# Patient Record
Sex: Female | Born: 1975 | Race: Black or African American | Hispanic: No | Smoking: Never smoker
Health system: Southern US, Community
[De-identification: ages and names within clinical notes are randomized; demographics above are authoritative.]

## PROBLEM LIST (undated history)

## (undated) DIAGNOSIS — R51 Headache: Secondary | ICD-10-CM

## (undated) DIAGNOSIS — N898 Other specified noninflammatory disorders of vagina: Secondary | ICD-10-CM

## (undated) DIAGNOSIS — J45909 Unspecified asthma, uncomplicated: Secondary | ICD-10-CM

## (undated) DIAGNOSIS — G8929 Other chronic pain: Secondary | ICD-10-CM

## (undated) DIAGNOSIS — N883 Incompetence of cervix uteri: Secondary | ICD-10-CM

## (undated) DIAGNOSIS — R109 Unspecified abdominal pain: Secondary | ICD-10-CM

## (undated) DIAGNOSIS — R1031 Right lower quadrant pain: Secondary | ICD-10-CM

## (undated) DIAGNOSIS — A599 Trichomoniasis, unspecified: Secondary | ICD-10-CM

## (undated) DIAGNOSIS — N920 Excessive and frequent menstruation with regular cycle: Secondary | ICD-10-CM

## (undated) DIAGNOSIS — M791 Myalgia, unspecified site: Secondary | ICD-10-CM

## (undated) DIAGNOSIS — R519 Headache, unspecified: Secondary | ICD-10-CM

## (undated) DIAGNOSIS — R609 Edema, unspecified: Secondary | ICD-10-CM

## (undated) DIAGNOSIS — R1032 Left lower quadrant pain: Secondary | ICD-10-CM

## (undated) DIAGNOSIS — R1011 Right upper quadrant pain: Secondary | ICD-10-CM

## (undated) DIAGNOSIS — R600 Localized edema: Secondary | ICD-10-CM

## (undated) DIAGNOSIS — M1990 Unspecified osteoarthritis, unspecified site: Secondary | ICD-10-CM

## (undated) HISTORY — PX: ENDOMETRIAL ABLATION: SHX621

## (undated) HISTORY — PX: OVARIAN CYST REMOVAL: SHX89

## (undated) HISTORY — DX: Right lower quadrant pain: R10.31

## (undated) HISTORY — PX: KNEE ARTHROSCOPY: SUR90

## (undated) HISTORY — DX: Excessive and frequent menstruation with regular cycle: N92.0

## (undated) HISTORY — DX: Unspecified asthma, uncomplicated: J45.909

## (undated) HISTORY — PX: TONSILLECTOMY: SUR1361

## (undated) HISTORY — DX: Left lower quadrant pain: R10.32

## (undated) HISTORY — PX: FOOT SURGERY: SHX648

## (undated) HISTORY — DX: Trichomoniasis, unspecified: A59.9

## (undated) HISTORY — DX: Incompetence of cervix uteri: N88.3

## (undated) HISTORY — DX: Myalgia, unspecified site: M79.10

## (undated) HISTORY — PX: TUBAL LIGATION: SHX77

## (undated) HISTORY — PX: OVARIAN CYST SURGERY: SHX726

## (undated) HISTORY — PX: DILATION AND CURETTAGE OF UTERUS: SHX78

## (undated) HISTORY — PX: ABDOMINAL HYSTERECTOMY: SHX81

## (undated) HISTORY — DX: Other specified noninflammatory disorders of vagina: N89.8

## (undated) HISTORY — PX: KNEE SURGERY: SHX244

## (undated) HISTORY — DX: Right upper quadrant pain: R10.11

---

## 2000-10-18 ENCOUNTER — Encounter: Payer: Self-pay | Admitting: *Deleted

## 2000-10-18 ENCOUNTER — Emergency Department (HOSPITAL_COMMUNITY): Admission: EM | Admit: 2000-10-18 | Discharge: 2000-10-18 | Payer: Self-pay | Admitting: *Deleted

## 2005-02-18 ENCOUNTER — Observation Stay (HOSPITAL_COMMUNITY): Admission: AD | Admit: 2005-02-18 | Discharge: 2005-02-19 | Payer: Self-pay | Admitting: Obstetrics and Gynecology

## 2007-03-08 ENCOUNTER — Ambulatory Visit: Payer: Self-pay | Admitting: Orthopedic Surgery

## 2008-04-07 ENCOUNTER — Ambulatory Visit (HOSPITAL_COMMUNITY): Admission: RE | Admit: 2008-04-07 | Discharge: 2008-04-07 | Payer: Self-pay | Admitting: Obstetrics & Gynecology

## 2008-04-17 ENCOUNTER — Other Ambulatory Visit: Admission: RE | Admit: 2008-04-17 | Discharge: 2008-04-17 | Payer: Self-pay | Admitting: Obstetrics and Gynecology

## 2008-04-18 ENCOUNTER — Ambulatory Visit (HOSPITAL_COMMUNITY): Admission: RE | Admit: 2008-04-18 | Discharge: 2008-04-18 | Payer: Self-pay | Admitting: Obstetrics & Gynecology

## 2009-05-12 ENCOUNTER — Ambulatory Visit (HOSPITAL_COMMUNITY): Admission: RE | Admit: 2009-05-12 | Discharge: 2009-05-12 | Payer: Self-pay | Admitting: Family Medicine

## 2009-05-12 ENCOUNTER — Encounter: Payer: Self-pay | Admitting: Cardiovascular Disease

## 2009-05-12 DIAGNOSIS — M79609 Pain in unspecified limb: Secondary | ICD-10-CM | POA: Insufficient documentation

## 2009-05-28 ENCOUNTER — Other Ambulatory Visit: Admission: RE | Admit: 2009-05-28 | Discharge: 2009-05-28 | Payer: Self-pay | Admitting: Obstetrics and Gynecology

## 2009-06-04 ENCOUNTER — Ambulatory Visit (HOSPITAL_COMMUNITY): Admission: RE | Admit: 2009-06-04 | Discharge: 2009-06-04 | Payer: Self-pay | Admitting: Obstetrics & Gynecology

## 2009-07-22 ENCOUNTER — Ambulatory Visit (HOSPITAL_COMMUNITY): Admission: RE | Admit: 2009-07-22 | Discharge: 2009-07-22 | Payer: Self-pay | Admitting: Obstetrics & Gynecology

## 2010-01-12 ENCOUNTER — Emergency Department (HOSPITAL_COMMUNITY)
Admission: EM | Admit: 2010-01-12 | Discharge: 2010-01-13 | Payer: Self-pay | Source: Home / Self Care | Admitting: Emergency Medicine

## 2010-02-28 ENCOUNTER — Encounter: Payer: Self-pay | Admitting: Family Medicine

## 2010-03-09 NOTE — Miscellaneous (Signed)
Summary: Orders Update  Clinical Lists Changes  Problems: Added new problem of LEG PAIN (ICD-729.5) Orders: Added new Test order of Venous Duplex Lower Extremity (Venous Duplex Lower) - Signed 

## 2010-04-19 LAB — COMPREHENSIVE METABOLIC PANEL
ALT: 16 U/L (ref 0–35)
AST: 17 U/L (ref 0–37)
Albumin: 4.2 g/dL (ref 3.5–5.2)
Alkaline Phosphatase: 87 U/L (ref 39–117)
BUN: 11 mg/dL (ref 6–23)
CO2: 27 mEq/L (ref 19–32)
Calcium: 9.5 mg/dL (ref 8.4–10.5)
Chloride: 105 mEq/L (ref 96–112)
Creatinine, Ser: 0.72 mg/dL (ref 0.4–1.2)
GFR calc Af Amer: >60 mL/min (ref >60)
GFR calc non Af Amer: >60 mL/min (ref >60)
Glucose, Bld: 90 mg/dL (ref 70–99)
Potassium: 3.7 mEq/L (ref 3.5–5.1)
Sodium: 140 mEq/L (ref 135–145)
Total Bilirubin: 0.4 mg/dL (ref 0.3–1.2)
Total Protein: 7.5 g/dL (ref 6.0–8.3)

## 2010-04-19 LAB — APTT: aPTT: 34 seconds (ref 24–37)

## 2010-04-19 LAB — CBC
HCT: 36.7 % (ref 36.0–46.0)
Hemoglobin: 12.6 g/dL (ref 12.0–15.0)
MCH: 28.8 pg (ref 26.0–34.0)
MCHC: 34.3 g/dL (ref 30.0–36.0)
MCV: 84 fL (ref 78.0–100.0)
Platelets: 261 10*3/uL (ref 150–400)
RBC: 4.37 MIL/uL (ref 3.87–5.11)
RDW: 13.7 % (ref 11.5–15.5)
WBC: 9.3 10*3/uL (ref 4.0–10.5)

## 2010-04-19 LAB — D-DIMER, QUANTITATIVE: D-Dimer, Quant: 0.42 ug/mL-FEU (ref 0.00–0.48)

## 2010-04-19 LAB — POCT CARDIAC MARKERS
CKMB, poc: <1 ng/mL — ABNORMAL LOW (ref 1.0–8.0)
Myoglobin, poc: 47.6 ng/mL (ref 12–200)
Troponin i, poc: <0.05 ng/mL (ref 0.00–0.09)

## 2010-04-19 LAB — PROTIME-INR
INR: 1 (ref 0.00–1.49)
Prothrombin Time: 13.4 seconds (ref 11.6–15.2)

## 2010-04-26 LAB — URINALYSIS, ROUTINE W REFLEX MICROSCOPIC
Bilirubin Urine: NEGATIVE
Glucose, UA: NEGATIVE mg/dL
Hgb urine dipstick: NEGATIVE
Ketones, ur: NEGATIVE mg/dL
Nitrite: NEGATIVE
Protein, ur: NEGATIVE mg/dL
Specific Gravity, Urine: 1.02 (ref 1.005–1.030)
Urobilinogen, UA: 0.2 mg/dL (ref 0.0–1.0)
pH: 7.5 (ref 5.0–8.0)

## 2010-04-26 LAB — COMPREHENSIVE METABOLIC PANEL
ALT: 35 U/L (ref 0–35)
AST: 24 U/L (ref 0–37)
Albumin: 3.9 g/dL (ref 3.5–5.2)
Alkaline Phosphatase: 81 U/L (ref 39–117)
BUN: 6 mg/dL (ref 6–23)
CO2: 26 mEq/L (ref 19–32)
Calcium: 9.2 mg/dL (ref 8.4–10.5)
Chloride: 104 mEq/L (ref 96–112)
Creatinine, Ser: 0.59 mg/dL (ref 0.4–1.2)
GFR calc Af Amer: >60 mL/min (ref >60)
GFR calc non Af Amer: >60 mL/min (ref >60)
Glucose, Bld: 86 mg/dL (ref 70–99)
Potassium: 4.1 mEq/L (ref 3.5–5.1)
Sodium: 137 mEq/L (ref 135–145)
Total Bilirubin: 0.4 mg/dL (ref 0.3–1.2)
Total Protein: 6.7 g/dL (ref 6.0–8.3)

## 2010-04-26 LAB — CBC
HCT: 37.7 % (ref 36.0–46.0)
Hemoglobin: 12.7 g/dL (ref 12.0–15.0)
MCHC: 33.6 g/dL (ref 30.0–36.0)
MCV: 88.4 fL (ref 78.0–100.0)
Platelets: 281 10*3/uL (ref 150–400)
RBC: 4.27 MIL/uL (ref 3.87–5.11)
RDW: 13.7 % (ref 11.5–15.5)
WBC: 7.8 10*3/uL (ref 4.0–10.5)

## 2010-04-26 LAB — HCG, QUANTITATIVE, PREGNANCY: hCG, Beta Chain, Quant, S: <2 m[IU]/mL (ref <5)

## 2010-04-26 LAB — SURGICAL PCR SCREEN
MRSA, PCR: NEGATIVE
Staphylococcus aureus: NEGATIVE

## 2010-06-25 NOTE — H&P (Signed)
Marie Morales, Marie Morales            ACCOUNT NO.:  [id number]   MEDICAL RECORD NO.:  [id number]          PATIENT TYPE:  OBV   LOCATION:  A428                          FACILITY:  [facility]   PHYSICIAN:  Tilda Burrow, M.D. DATE OF BIRTH:  [birth year]   DATE OF ADMISSION:  02/18/2005  DATE OF DISCHARGE:  LH                                HISTORY & PHYSICAL   CHIEF COMPLAINT:  Nausea, vomiting and constipation.   HISTORY OF PRESENT ILLNESS:  A 35 year old, G2, P1 who is approximately 56-  4/7 weeks' gestation who I have been seeing every few days for the past week  or so trying to manage her nausea and vomiting.  She does okay on the Zofran  and tries to go to work, but since last night she has just not been able to  keep anything down at all.  She does have 3+ ketones in her urine.  She also  complains of not having a bowel movement for a week.  She also complains of  a right flank aching type pain that she has been having for several months  now.  It starts in her right flank and radiates to her groin area.  It is  tender to touch and intermittent.  She says it lasts for about 45 minutes at  a time when she has it.  She has been having that ache more frequently.  An  ultrasound early on was done that did not reveal any abnormalities.  She has  complaints of some dizziness and weakness which is probably coming from her  dehydration.   IMPRESSION:  1.  Intrauterine pregnancy at 12-4/7 weeks.  2.  Nausea and vomiting.  3.  Dehydration.  4.  Constipation.  5.  Right flank pain radiating to groin, questionable abdominal wall pain.   PLAN:  We will send her over to labor and delivery for some hydration and  antiemetics and also milk and molasses enema.      Jacklyn Shell, C.N.M.      Tilda Burrow, M.D.  Electronically Signed    FC/MEDQ  D:  02/18/2005  T:  02/18/2005  Job:  [id number]

## 2010-12-24 ENCOUNTER — Other Ambulatory Visit: Payer: Self-pay | Admitting: Obstetrics & Gynecology

## 2012-04-19 IMAGING — US US TRANSVAGINAL NON-OB
1 series · 14 of 25 positions shown · non-contrast
Comparison: 04/07/2008

CLINICAL DATA: Right lower quadrant pain

TRANSABDOMINAL AND TRANSVAGINAL ULTRASOUND OF PELVIS
TECHNIQUE: Both transabdominal and transvaginal ultrasound
examinations of the pelvis were performed including evaluation of
the uterus, ovaries, adnexal regions, and pelvic cul-de-sac.

[Series 1: us transvaginal non-ob · 0.37mm/px · 77 acquisitions, 14 frames shown]
[im 1/77]
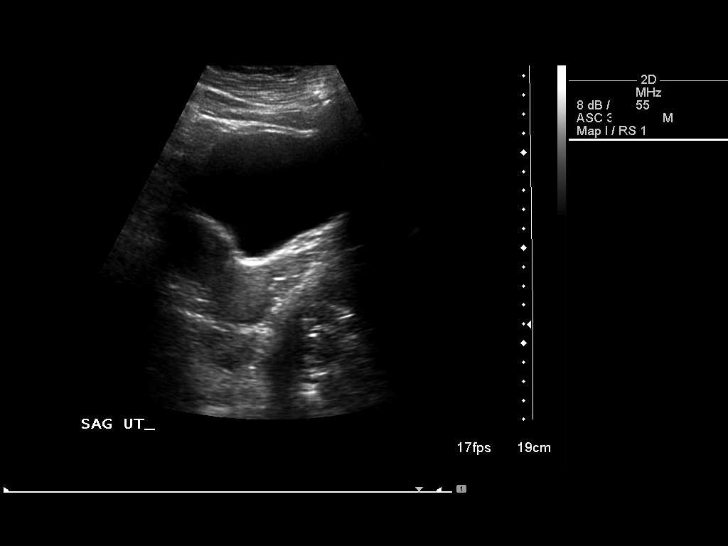
[im 7/77]
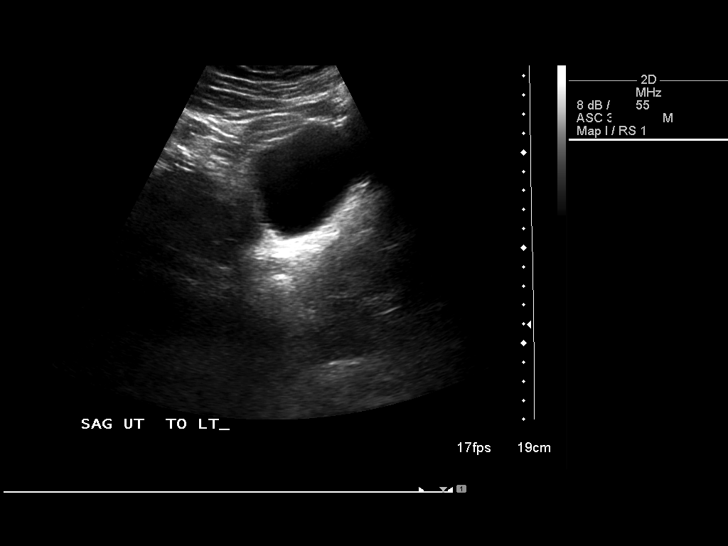
[im 13/77]
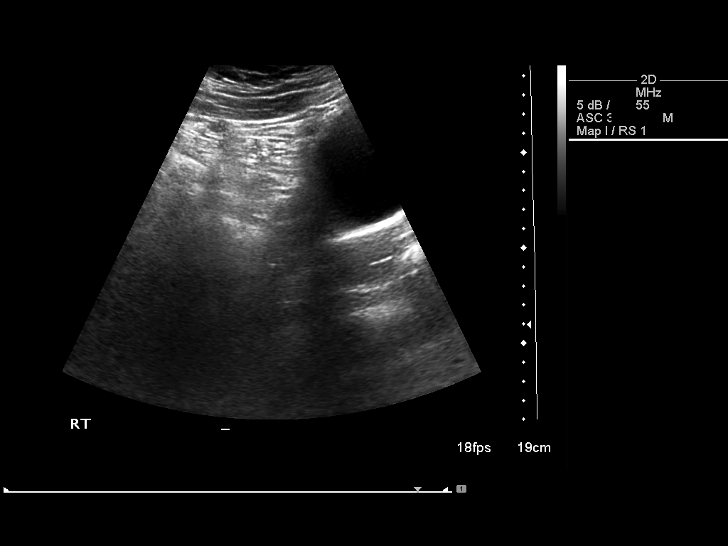
[im 20/77]
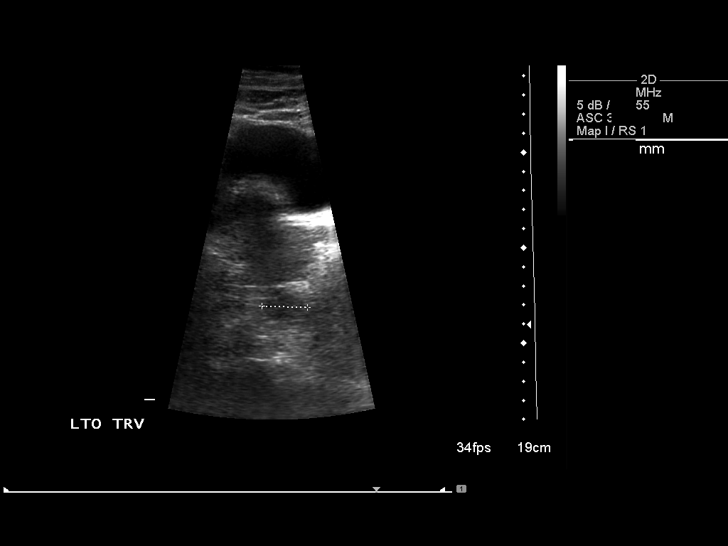
[im 26/77]
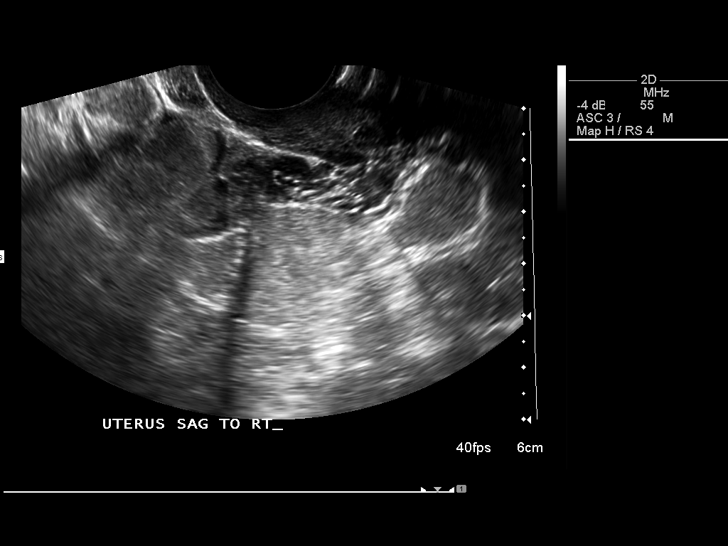
[im 29/77]
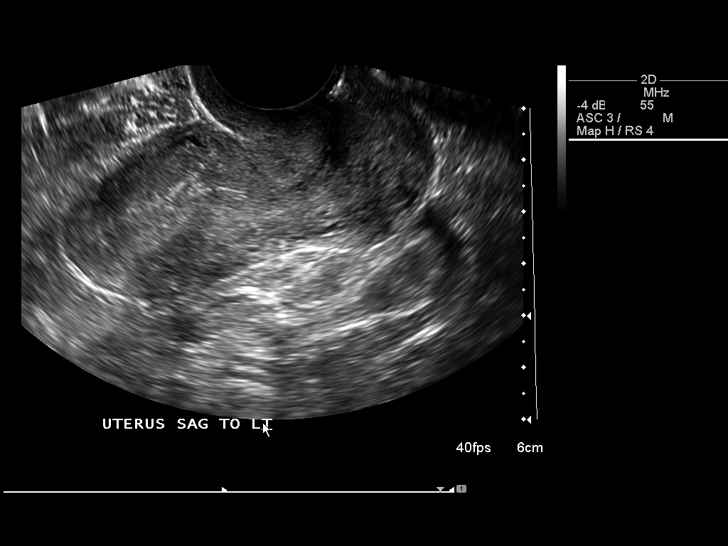
[im 35/77]
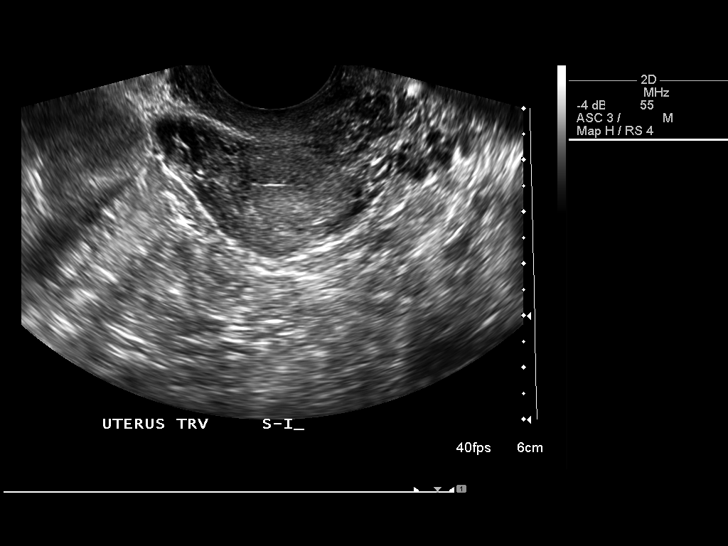
[im 42/77]
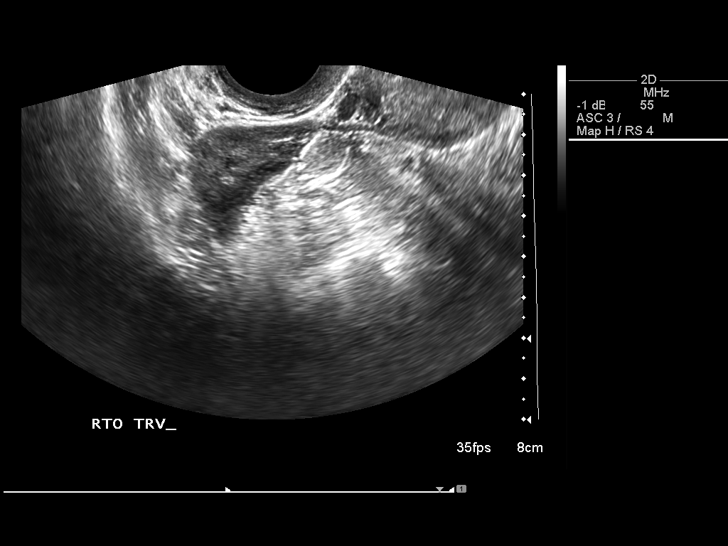
[im 48/77]
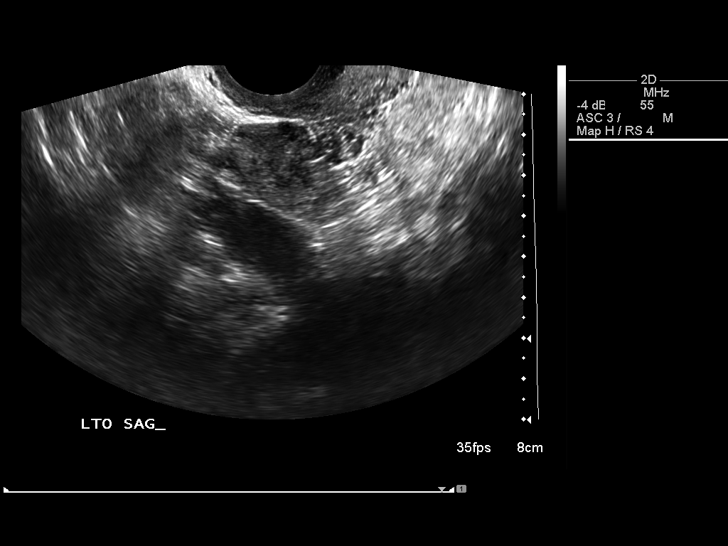
[im 51/77]
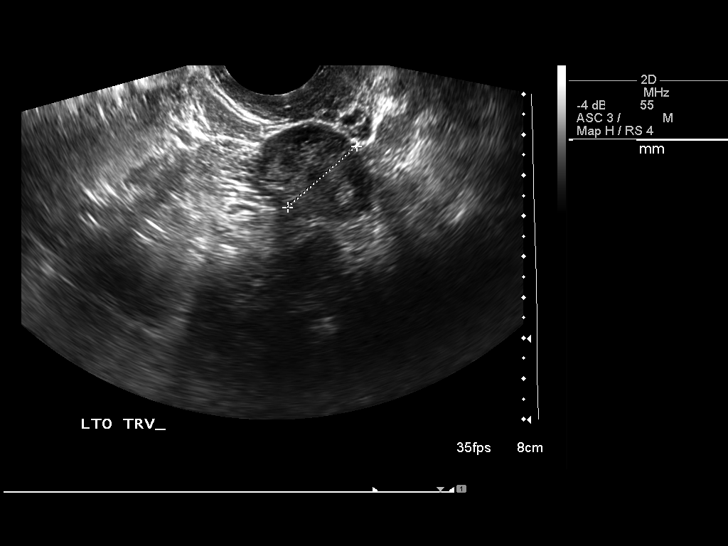
[im 58/77]
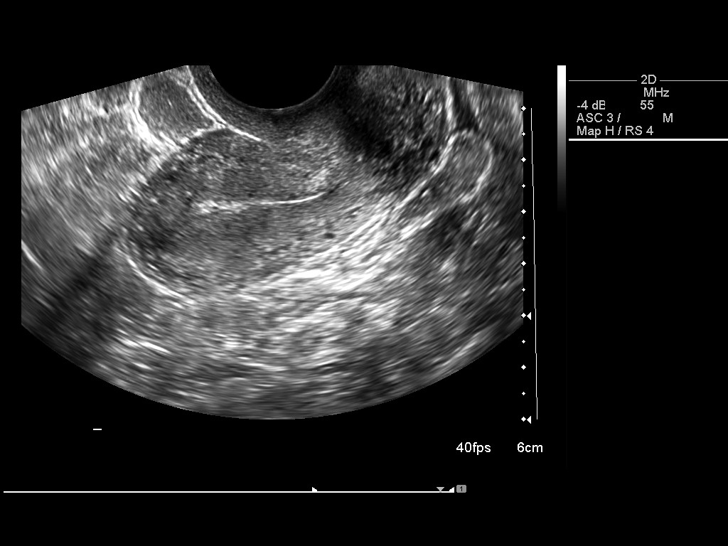
[im 64/77]
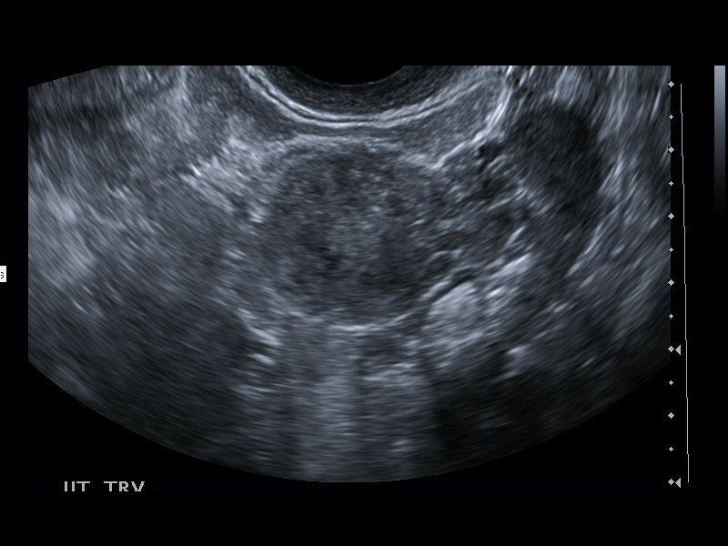
[im 70/77]
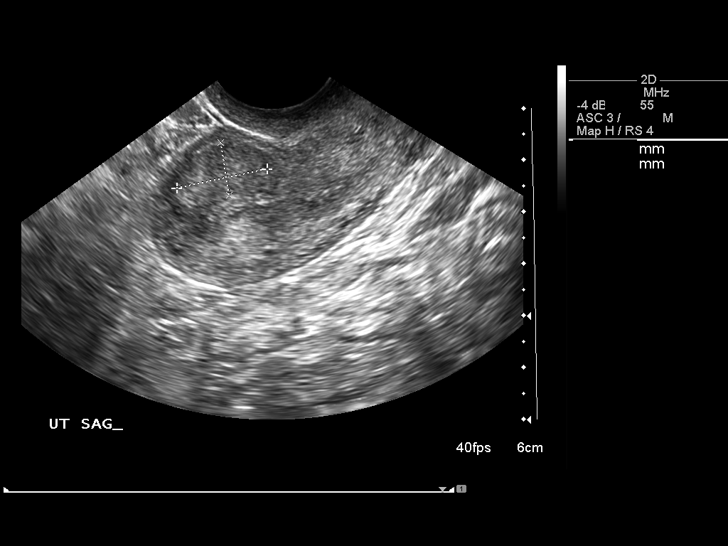
[im 77/77]
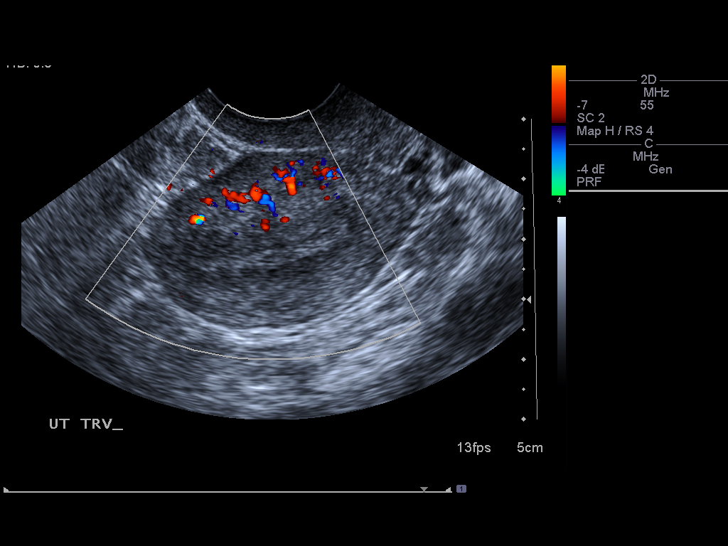

[14 of 25 positions shown; findings below may reference images not displayed]

FINDINGS: Uterus the uterus demonstrates a sagittal length of 7.2 cm, an AP
width of 3.1 cm and a transverse width of 3.6 cm.  The uterine
myometrium appears homogeneous.

Endometrium is thin and echogenic with an AP width of 2.8 mm.  No
areas of focal thickening or inhomogeneity are noted

Right Ovary has a normal appearance measuring 2.9 x 3.2 x 2.4 cm

Left Ovary has a normal appearance measuring 3.5 x 2.1 x 2.3 cm

Other Findings:  A small amount of simple free fluid is noted in
the cul-de-sac.
IMPRESSION: Unremarkable pelvic ultrasound.

## 2012-05-30 ENCOUNTER — Telehealth: Payer: Self-pay | Admitting: *Deleted

## 2012-05-30 NOTE — Telephone Encounter (Signed)
Pain on both sides of abdomen, hx of ovarian cyst. Pain rated at 9 on 1-10 scale. Taking Ibuprofen 800 mg and taking hydrocodone due to pain.  Informed pt may need to go to ER if severe pain persist before being able to be seen at our office, pt stated preferred to be seen here. Transferred pt to front office to schedule appt for next available appt.

## 2012-05-31 ENCOUNTER — Encounter: Payer: Self-pay | Admitting: *Deleted

## 2012-05-31 DIAGNOSIS — J45909 Unspecified asthma, uncomplicated: Secondary | ICD-10-CM | POA: Insufficient documentation

## 2012-06-01 ENCOUNTER — Encounter: Payer: Self-pay | Admitting: Obstetrics & Gynecology

## 2012-06-01 ENCOUNTER — Ambulatory Visit (INDEPENDENT_AMBULATORY_CARE_PROVIDER_SITE_OTHER): Payer: No Typology Code available for payment source | Admitting: Obstetrics & Gynecology

## 2012-06-01 VITALS — BP 128/80 | Ht 73.0 in | Wt 261.0 lb

## 2012-06-01 DIAGNOSIS — R1032 Left lower quadrant pain: Secondary | ICD-10-CM

## 2012-06-01 DIAGNOSIS — R1031 Right lower quadrant pain: Secondary | ICD-10-CM

## 2012-06-01 MED ORDER — HYDROCODONE-ACETAMINOPHEN 5-325 MG PO TABS: 1.0000 | ORAL_TABLET | Freq: Four times a day (QID) | ORAL | Status: DC | PRN

## 2012-06-01 NOTE — Progress Notes (Signed)
Patient ID: Marie Morales, female   DOB: [birth year], 37 y.o.   MRN: [id number] Patient with pain for 5 months.  Bilateral L.>R Sharp especially with twisting movements Last a few seconds to a minute Radiates to R leg>L leg  Exam Point tenderness with contracted abs at junction of rectus abdominus an obliques Pelvic No cmt Uterus nssc Ovaries non tender no masses  Keep yearly appt scheduled

## 2012-06-01 NOTE — Addendum Note (Signed)
Addended by: Lazaro Arms on: 06/01/2012 10:49 AM   Modules accepted: Orders

## 2012-06-18 ENCOUNTER — Other Ambulatory Visit: Payer: Self-pay | Admitting: Adult Health

## 2012-08-01 ENCOUNTER — Other Ambulatory Visit: Payer: Self-pay | Admitting: Adult Health

## 2012-08-22 ENCOUNTER — Other Ambulatory Visit: Payer: Self-pay | Admitting: Adult Health

## 2012-08-22 ENCOUNTER — Ambulatory Visit (HOSPITAL_COMMUNITY)
Admission: RE | Admit: 2012-08-22 | Discharge: 2012-08-22 | Disposition: A | Discharge status: Home / Self Care | Payer: BC Managed Care – PPO | Source: Ambulatory Visit | Attending: Adult Health | Admitting: Adult Health

## 2012-08-22 ENCOUNTER — Encounter: Payer: Self-pay | Admitting: Adult Health

## 2012-08-22 ENCOUNTER — Ambulatory Visit (INDEPENDENT_AMBULATORY_CARE_PROVIDER_SITE_OTHER): Payer: BC Managed Care – PPO | Admitting: Adult Health

## 2012-08-22 ENCOUNTER — Telehealth: Payer: Self-pay | Admitting: Adult Health

## 2012-08-22 VITALS — BP 104/80 | Ht 73.0 in | Wt 254.0 lb

## 2012-08-22 DIAGNOSIS — R1032 Left lower quadrant pain: Secondary | ICD-10-CM

## 2012-08-22 DIAGNOSIS — N898 Other specified noninflammatory disorders of vagina: Secondary | ICD-10-CM

## 2012-08-22 DIAGNOSIS — A599 Trichomoniasis, unspecified: Secondary | ICD-10-CM

## 2012-08-22 DIAGNOSIS — R1011 Right upper quadrant pain: Secondary | ICD-10-CM

## 2012-08-22 DIAGNOSIS — R300 Dysuria: Secondary | ICD-10-CM

## 2012-08-22 DIAGNOSIS — N83209 Unspecified ovarian cyst, unspecified side: Secondary | ICD-10-CM | POA: Insufficient documentation

## 2012-08-22 DIAGNOSIS — N72 Inflammatory disease of cervix uteri: Secondary | ICD-10-CM | POA: Insufficient documentation

## 2012-08-22 HISTORY — DX: Trichomoniasis, unspecified: A59.9

## 2012-08-22 HISTORY — DX: Left lower quadrant pain: R10.32

## 2012-08-22 HISTORY — DX: Right upper quadrant pain: R10.11

## 2012-08-22 LAB — POCT WET PREP (WET MOUNT)
Trichomonas Wet Prep HPF POC: POSITIVE
WBC, Wet Prep HPF POC: POSITIVE

## 2012-08-22 LAB — COMPREHENSIVE METABOLIC PANEL
ALT: 14 U/L (ref 0–35)
AST: 13 U/L (ref 0–37)
Albumin: 4.1 g/dL (ref 3.5–5.2)
Alkaline Phosphatase: 71 U/L (ref 39–117)
BUN: 6 mg/dL (ref 6–23)
CO2: 25 mEq/L (ref 19–32)
Calcium: 9.4 mg/dL (ref 8.4–10.5)
Chloride: 105 mEq/L (ref 96–112)
Creat: 0.65 mg/dL (ref 0.50–1.10)
Glucose, Bld: 68 mg/dL — ABNORMAL LOW (ref 70–99)
Potassium: 4.4 mEq/L (ref 3.5–5.3)
Sodium: 138 mEq/L (ref 135–145)
Total Bilirubin: 0.5 mg/dL (ref 0.3–1.2)
Total Protein: 6.5 g/dL (ref 6.0–8.3)

## 2012-08-22 LAB — CBC
HCT: 38.7 % (ref 36.0–46.0)
Hemoglobin: 13 g/dL (ref 12.0–15.0)
MCH: 29.3 pg (ref 26.0–34.0)
MCHC: 33.6 g/dL (ref 30.0–36.0)
MCV: 87.2 fL (ref 78.0–100.0)
Platelets: 292 10*3/uL (ref 150–400)
RBC: 4.44 MIL/uL (ref 3.87–5.11)
RDW: 13.8 % (ref 11.5–15.5)
WBC: 5.8 10*3/uL (ref 4.0–10.5)

## 2012-08-22 LAB — POCT URINALYSIS DIPSTICK
Blood, UA: 1
Glucose, UA: NEGATIVE
Ketones, UA: NEGATIVE
Nitrite, UA: NEGATIVE
Protein, UA: NEGATIVE

## 2012-08-22 MED ORDER — METRONIDAZOLE 500 MG PO TABS: 500.0000 mg | ORAL_TABLET | Freq: Two times a day (BID) | ORAL | Status: DC

## 2012-08-22 NOTE — Progress Notes (Signed)
Subjective:     Patient ID: Marie Morales, female   DOB: 1975/09/29, 37 y.o.   MRN: [id number]  HPI Marie Morales is a 37 year old black female in complaining of pain in RUQ area and right flank x 4 weeks worse after eating pizza.Pain in LLQ and recent bleeding and clotting after abaltion and vaginal discharge and feels like bladder not emptying. She has had nausea no vomiting.  Review of Systems Positives in HPI Reviewed past medical,surgical, social and family history. Reviewed medications and allergies.     Objective:   Physical Exam BP 104/80  Ht 6\' 1"  (1.854 m)  Wt 254 lb (115.214 kg)  BMI 33.52 kg/m2Urine dipstick +blood Skin warm and dry.Pelvic: external genitalia is normal in appearance, vagina: tan discharge with odor, cervix:smooth and bulbous,negative CMT, uterus: normal size, shape and contour, non tender, no masses felt, adnexa: no masses, there is tenderness in LLQ. Wet prep: + for trich and +WBCs. GC/CHL obtained.    On abdominal exam there is RUQ tenderness and pain in right flank with palpation, no masses felt.  Assessment:      RUQ pain  LLQ pain Vaginal discharge Trichomonas Hematuria     Plan:      Check CBC,CMP,ESR and GC/CHL Rx flagyl 500 mg 1 bid x 7 days for pt and partner, no sex and no alcohol Review handout on trich ABD [place] and Pelvic US today at 2 pm at [facility], to r/o gallbladder or kidney stone Follow in 10 days proof of treatment of trich, and will talk in am about labs and [place]   Take motrin for pain for now

## 2012-08-22 NOTE — Telephone Encounter (Signed)
Pt aware of US results. 

## 2012-08-22 NOTE — Patient Instructions (Addendum)
Trichomoniasis Trichomoniasis is an infection, caused by the Trichomonas organism, that affects both women and men. In women, the outer female genitalia and the vagina are affected. In men, the penis is mainly affected, but the prostate and other reproductive organs can also be involved. Trichomoniasis is a sexually transmitted disease (STD) and is most often passed to another person through sexual contact. The majority of people who get trichomoniasis do so from a sexual encounter and are also at risk for other STDs. CAUSES   Sexual intercourse with an infected partner.  It can be present in swimming pools or hot tubs. SYMPTOMS   Abnormal gray-green frothy vaginal discharge in women.  Vaginal itching and irritation in women.  Itching and irritation of the area outside the vagina in women.  Penile discharge with or without pain in males.  Inflammation of the urethra (urethritis), causing painful urination.  Bleeding after sexual intercourse. RELATED COMPLICATIONS  Pelvic inflammatory disease.  Infection of the uterus (endometritis).  Infertility.  Tubal (ectopic) pregnancy.  It can be associated with other STDs, including gonorrhea and chlamydia, hepatitis B, and HIV. COMPLICATIONS DURING PREGNANCY  Early (premature) delivery.  Premature rupture of the membranes (PROM).  Low birth weight. DIAGNOSIS   Visualization of Trichomonas under the microscope from the vagina discharge.  Ph of the vagina greater than 4.5, tested with a test tape.  Trich Rapid Test.  Culture of the organism, but this is not usually needed.  It may be found on a Pap test.  Having a "strawberry cervix,"which means the cervix looks very red like a strawberry. TREATMENT   You may be given medication to fight the infection. Inform your caregiver if you could be or are pregnant. Some medications used to treat the infection should not be taken during pregnancy.  Over-the-counter medications or  creams to decrease itching or irritation may be recommended.  Your sexual partner will need to be treated if infected. HOME CARE INSTRUCTIONS   Take all medication prescribed by your caregiver.  Take over-the-counter medication for itching or irritation as directed by your caregiver.  Do not have sexual intercourse while you have the infection.  Do not douche or wear tampons.  Discuss your infection with your partner, as your partner may have acquired the infection from you. Or, your partner may have been the person who transmitted the infection to you.  Have your sex partner examined and treated if necessary.  Practice safe, informed, and protected sex.  See your caregiver for other STD testing. SEEK MEDICAL CARE IF:   You still have symptoms after you finish the medication.  You have an oral temperature above 102 F (38.9 C).  You develop belly (abdominal) pain.  You have pain when you urinate.  You have bleeding after sexual intercourse.  You develop a rash.  The medication makes you sick or makes you throw up (vomit). Document Released: 07/20/2000 Document Revised: 04/18/2011 Document Reviewed: 08/15/2008 [facility] Patient Information 2014 [place], [place]. Follow up in 10 days Get [place] today  Call for labs in am

## 2012-08-23 ENCOUNTER — Telehealth: Payer: Self-pay | Admitting: Adult Health

## 2012-08-23 ENCOUNTER — Other Ambulatory Visit (HOSPITAL_COMMUNITY): Payer: Self-pay

## 2012-08-23 LAB — SEDIMENTATION RATE

## 2012-08-23 LAB — GC/CHLAMYDIA PROBE AMP
CT Probe RNA: NEGATIVE
GC Probe RNA: NEGATIVE

## 2012-08-23 NOTE — Telephone Encounter (Signed)
Pt aware of labs and needs note for missing work 7/15 & 7/16

## 2012-09-03 ENCOUNTER — Ambulatory Visit: Payer: BC Managed Care – PPO | Admitting: Adult Health

## 2012-09-07 ENCOUNTER — Ambulatory Visit: Payer: BC Managed Care – PPO | Admitting: Adult Health

## 2012-09-10 ENCOUNTER — Encounter: Payer: Self-pay | Admitting: Adult Health

## 2012-09-10 ENCOUNTER — Ambulatory Visit (INDEPENDENT_AMBULATORY_CARE_PROVIDER_SITE_OTHER): Payer: BC Managed Care – PPO | Admitting: Adult Health

## 2012-09-10 VITALS — BP 118/84 | Ht 73.0 in | Wt 257.0 lb

## 2012-09-10 DIAGNOSIS — A599 Trichomoniasis, unspecified: Secondary | ICD-10-CM

## 2012-09-10 DIAGNOSIS — R1031 Right lower quadrant pain: Secondary | ICD-10-CM | POA: Insufficient documentation

## 2012-09-10 DIAGNOSIS — IMO0001 Reserved for inherently not codable concepts without codable children: Secondary | ICD-10-CM

## 2012-09-10 DIAGNOSIS — M791 Myalgia, unspecified site: Secondary | ICD-10-CM

## 2012-09-10 HISTORY — DX: Right lower quadrant pain: R10.31

## 2012-09-10 HISTORY — DX: Myalgia, unspecified site: M79.10

## 2012-09-10 LAB — POCT WET PREP (WET MOUNT)
Trichomonas Wet Prep HPF POC: NEGATIVE
WBC, Wet Prep HPF POC: NEGATIVE

## 2012-09-10 MED ORDER — METAXALONE 800 MG PO TABS: 800.0000 mg | ORAL_TABLET | Freq: Three times a day (TID) | ORAL | Status: DC

## 2012-09-10 NOTE — Progress Notes (Signed)
Subjective:     Patient ID: MAELEY MATTON, female   DOB: [birth year], 37 y.o.   MRN: [id number]  HPI Naliyah is back for proof of treatment of recent trichomonas and is complaining of pain in right side still.the US showed bilateral cysts.  Review of Systems Positives in HPI  Reviewed past medical,surgical, social and family history. Reviewed medications and allergies.      Objective:   Physical Exam BP 118/84  Ht 6\' 1"  (1.854 m)  Wt 257 lb (116.574 kg)  BMI 33.91 kg/m2   Skin warm and dry.Pelvic: external genitalia is normal in appearance, vagina: scant discharge without odor, cervix:smooth and bulbous, uterus: normal size, shape and contour, non tender, no masses felt, adnexa: no masses noted. Tender RLQ ?muscle. Wet prep: negative  Assessment:     RLQ pain ?muscle Myalgia Resolved trichomonas     Plan:     RX skelaxin 800 mg #30 1 every 8 hours no refills Try stretches and ice and review handout on myalgia Follow up in 4 weeks

## 2012-09-10 NOTE — Patient Instructions (Addendum)
Myalgia, Adult Myalgia is the medical term for muscle pain. It is a symptom of many things. Nearly everyone at some time in their life has this. The most common cause for muscle pain is overuse or straining and more so when you are not in shape. Injuries and muscle bruises cause myalgias. Muscle pain without a history of injury can also be caused by a virus. It frequently comes along with the flu. Myalgia not caused by muscle strain can be present in a large number of infectious diseases. Some autoimmune diseases like lupus and fibromyalgia can cause muscle pain. Myalgia may be mild, or severe. SYMPTOMS  The symptoms of myalgia are simply muscle pain. Most of the time this is short lived and the pain goes away without treatment. DIAGNOSIS  Myalgia is diagnosed by your caregiver by taking your history. This means you tell him when the problems began, what they are, and what has been happening. If this has not been a long term problem, your caregiver may want to watch for a while to see what will happen. If it has been long term, they may want to do additional testing. TREATMENT  The treatment depends on what the underlying cause of the muscle pain is. Often anti-inflammatory medications will help. HOME CARE INSTRUCTIONS  If the pain in your muscles came from overuse, slow down your activities until the problems go away.  Myalgia from overuse of a muscle can be treated with alternating hot and cold packs on the muscle affected or with cold for the first couple days. If either heat or cold seems to make things worse, stop their use.  Apply ice to the sore area for 15-20 minutes, 3-4 times per day, while awake for the first 2 days of muscle soreness, or as directed. Put the ice in a plastic bag and place a towel between the bag of ice and your skin.  Only take over-the-counter or prescription medicines for pain, discomfort, or fever as directed by your caregiver.  Regular gentle exercise may help if  you are not active.  Stretching before strenuous exercise can help lower the risk of myalgia. It is normal when beginning an exercise regimen to feel some muscle pain after exercising. Muscles that have not been used frequently will be sore at first. If the pain is extreme, this may mean injury to a muscle. SEEK MEDICAL CARE IF:  You have an increase in muscle pain that is not relieved with medication.  You begin to run a temperature.  You develop nausea and vomiting.  You develop a stiff and painful neck.  You develop a rash.  You develop muscle pain after a tick bite.  You have continued muscle pain while working out even after you are in good condition. SEEK IMMEDIATE MEDICAL CARE IF: Any of your problems are getting worse and medications are not helping. MAKE SURE YOU:   Understand these instructions.  Will watch your condition.  Will get help right away if you are not doing well or get worse. Document Released: 12/16/2005 Document Revised: 04/18/2011 Document Reviewed: 03/07/2006 [facility] Patient Information 2014 [place], [place]. Follow up in 4 weeks Try skelexin and motrin and try ice and stretches

## 2012-10-09 ENCOUNTER — Ambulatory Visit: Payer: BC Managed Care – PPO | Admitting: Adult Health

## 2012-10-10 ENCOUNTER — Ambulatory Visit: Payer: BC Managed Care – PPO | Admitting: Adult Health

## 2012-10-23 ENCOUNTER — Ambulatory Visit: Payer: BC Managed Care – PPO | Admitting: Adult Health

## 2013-02-26 ENCOUNTER — Ambulatory Visit (INDEPENDENT_AMBULATORY_CARE_PROVIDER_SITE_OTHER): Payer: BC Managed Care – PPO | Admitting: Adult Health

## 2013-02-26 ENCOUNTER — Encounter: Payer: Self-pay | Admitting: Adult Health

## 2013-02-26 VITALS — BP 112/76 | Ht 73.0 in | Wt 253.0 lb

## 2013-02-26 DIAGNOSIS — A599 Trichomoniasis, unspecified: Secondary | ICD-10-CM

## 2013-02-26 DIAGNOSIS — N949 Unspecified condition associated with female genital organs and menstrual cycle: Secondary | ICD-10-CM

## 2013-02-26 DIAGNOSIS — N898 Other specified noninflammatory disorders of vagina: Secondary | ICD-10-CM

## 2013-02-26 HISTORY — DX: Other specified noninflammatory disorders of vagina: N89.8

## 2013-02-26 LAB — POCT WET PREP (WET MOUNT)
Trichomonas Wet Prep HPF POC: POSITIVE
WBC, Wet Prep HPF POC: POSITIVE

## 2013-02-26 MED ORDER — METRONIDAZOLE 500 MG PO TABS: ORAL_TABLET | ORAL | Status: DC

## 2013-02-26 NOTE — Progress Notes (Signed)
Subjective:     Patient ID: Marie Morales, female   DOB: 10-14-1975, 38 y.o.   MRN: [id number]  HPI Marie Morales is a 38 year old black female in complaining of vaginal discharge and irritation, used different soap.Last sex Thanksgiving.Spotting some since ablation and pain in left side at times.   Review of Systems See HPI Reviewed past medical,surgical, social and family history. Reviewed medications and allergies.     Objective:   Physical Exam BP 112/76  Ht 6\' 1"  (1.854 m)  Wt 253 lb (114.76 kg)  BMI 33.39 kg/m2    Skin warm and dry.Pelvic: external genitalia is normal in appearance, vagina: pink discharge with odor, cervix:smooth and bulbous, uterus: normal size, shape and contour, non tender, no masses felt, adnexa: no masses or tenderness noted. Wet prep: + for trich and +WBCs. GC/CHL obtained.  Assessment:    Vaginal discharge Trichomoniasis Pelvic pain esp left    Plan:     Rx flagyl 500 mg #4 take 4 po now no sex or alcohol Review handout  Follow up in 10 days for POT and [place] and see me   Check GC/CHL

## 2013-02-26 NOTE — Patient Instructions (Signed)
Trichomoniasis Trichomoniasis is an infection, caused by the Trichomonas organism, that affects both women and men. In women, the outer female genitalia and the vagina are affected. In men, the penis is mainly affected, but the prostate and other reproductive organs can also be involved. Trichomoniasis is a sexually transmitted disease (STD) and is most often passed to another person through sexual contact. The majority of people who get trichomoniasis do so from a sexual encounter and are also at risk for other STDs. CAUSES   Sexual intercourse with an infected partner.  It can be present in swimming pools or hot tubs. SYMPTOMS   Abnormal gray-green frothy vaginal discharge in women.  Vaginal itching and irritation in women.  Itching and irritation of the area outside the vagina in women.  Penile discharge with or without pain in males.  Inflammation of the urethra (urethritis), causing painful urination.  Bleeding after sexual intercourse. RELATED COMPLICATIONS  Pelvic inflammatory disease.  Infection of the uterus (endometritis).  Infertility.  Tubal (ectopic) pregnancy.  It can be associated with other STDs, including gonorrhea and chlamydia, hepatitis B, and HIV. COMPLICATIONS DURING PREGNANCY  Early (premature) delivery.  Premature rupture of the membranes (PROM).  Low birth weight. DIAGNOSIS   Visualization of Trichomonas under the microscope from the vagina discharge.  Ph of the vagina greater than 4.5, tested with a test tape.  Trich Rapid Test.  Culture of the organism, but this is not usually needed.  It may be found on a Pap test.  Having a "strawberry cervix,"which means the cervix looks very red like a strawberry. TREATMENT   You may be given medication to fight the infection. Inform your caregiver if you could be or are pregnant. Some medications used to treat the infection should not be taken during pregnancy.  Over-the-counter medications or  creams to decrease itching or irritation may be recommended.  Your sexual partner will need to be treated if infected. HOME CARE INSTRUCTIONS   Take all medication prescribed by your caregiver.  Take over-the-counter medication for itching or irritation as directed by your caregiver.  Do not have sexual intercourse while you have the infection.  Do not douche or wear tampons.  Discuss your infection with your partner, as your partner may have acquired the infection from you. Or, your partner may have been the person who transmitted the infection to you.  Have your sex partner examined and treated if necessary.  Practice safe, informed, and protected sex.  See your caregiver for other STD testing. SEEK MEDICAL CARE IF:   You still have symptoms after you finish the medication.  You have an oral temperature above 102 F (38.9 C).  You develop belly (abdominal) pain.  You have pain when you urinate.  You have bleeding after sexual intercourse.  You develop a rash.  The medication makes you sick or makes you throw up (vomit). Document Released: 07/20/2000 Document Revised: 04/18/2011 Document Reviewed: 08/15/2008 [facility] Patient Information 2014 [place], [place]. Take flagyl  no alcohol Follow up in 10 days for [place] and pOT

## 2013-02-27 ENCOUNTER — Telehealth: Payer: Self-pay | Admitting: Adult Health

## 2013-02-27 LAB — GC/CHLAMYDIA PROBE AMP
CT Probe RNA: NEGATIVE
GC Probe RNA: NEGATIVE

## 2013-02-27 NOTE — Telephone Encounter (Signed)
Left message that gc/chl negative

## 2013-03-08 ENCOUNTER — Ambulatory Visit (INDEPENDENT_AMBULATORY_CARE_PROVIDER_SITE_OTHER): Payer: BC Managed Care – PPO | Admitting: Adult Health

## 2013-03-08 ENCOUNTER — Ambulatory Visit (INDEPENDENT_AMBULATORY_CARE_PROVIDER_SITE_OTHER): Payer: BC Managed Care – PPO

## 2013-03-08 ENCOUNTER — Encounter: Payer: Self-pay | Admitting: Adult Health

## 2013-03-08 VITALS — BP 120/88 | Ht 73.0 in | Wt 255.0 lb

## 2013-03-08 DIAGNOSIS — N949 Unspecified condition associated with female genital organs and menstrual cycle: Secondary | ICD-10-CM

## 2013-03-08 DIAGNOSIS — A599 Trichomoniasis, unspecified: Secondary | ICD-10-CM

## 2013-03-08 LAB — POCT WET PREP (WET MOUNT)
Trichomonas Wet Prep HPF POC: NEGATIVE
WBC, Wet Prep HPF POC: NEGATIVE

## 2013-03-08 NOTE — Patient Instructions (Signed)
Return in 10 months physical and pap Call prn

## 2013-03-08 NOTE — Progress Notes (Signed)
Subjective:     Patient ID: Marie Morales, female   DOB: 06-Aug-1975, 38 y.o.   MRN: [id number]  HPI Shonna is back for [place] for pelvic pain and proof of treatment for trich.  Review of Systems See HPI Reviewed past medical,surgical, social and family history. Reviewed medications and allergies.     Objective:   Physical Exam BP 120/88  Ht 6\' 1"  (1.854 m)  Wt 255 lb (115.667 kg)  BMI 33.65 kg/m2   Skin warm and dry.Pelvic: external genitalia is normal in appearance, vagina: scant discharge without odor, cervix:smooth and bulbous, uterus: normal size, shape and contour, non tender, no masses felt, adnexa: no masses or tenderness noted. Wet prep: negative, reviewed [place] with pt. Uterus 7.4 x 4.7 x 3.7cm, anteverted no myometrial masses noted  Endometrium 4.2 mm, symmetrical, s/p ablation  Right ovary 4.3 x 3.2 x 3.2 cm, with 3.1 x 2.8cm simple cyst noted  Left ovary 2.9 x 2.4 x 2.3 cm,  No free fluid noted within pelvis  Technician Comments:  Anteverted uterus, Endom-4.55mm, Rt ovary with 3.1 x 2.8cm simple cyst, Lt ovary appears WNL, no free fluid noted within pelvis  Assessment:     Pelvic pain that comes and goes Resolved trichomonas    Plan:     Use condoms Return in 10 months for pap and physical Call prn problems   Take motrin prn

## 2013-12-03 ENCOUNTER — Ambulatory Visit: Payer: Self-pay | Admitting: Obstetrics and Gynecology

## 2013-12-03 LAB — CBC
HCT: 39.1 % (ref 35.0–47.0)
HGB: 12.6 g/dL (ref 12.0–16.0)
MCH: 29 pg (ref 26.0–34.0)
MCHC: 32.2 g/dL (ref 32.0–36.0)
MCV: 90 fL (ref 80–100)
Platelet: 293 10*3/uL (ref 150–440)
RBC: 4.34 10*6/uL (ref 3.80–5.20)
RDW: 13.3 % (ref 11.5–14.5)
WBC: 8.9 10*3/uL (ref 3.6–11.0)

## 2013-12-09 ENCOUNTER — Encounter: Payer: Self-pay | Admitting: Adult Health

## 2013-12-10 ENCOUNTER — Ambulatory Visit: Payer: Self-pay | Admitting: Obstetrics and Gynecology

## 2014-05-28 ENCOUNTER — Emergency Department (HOSPITAL_COMMUNITY): Payer: BC Managed Care – PPO

## 2014-05-28 ENCOUNTER — Emergency Department (HOSPITAL_COMMUNITY)
Admission: EM | Admit: 2014-05-28 | Discharge: 2014-05-28 | Disposition: A | Discharge status: Home / Self Care | Payer: BC Managed Care – PPO | Attending: Emergency Medicine | Admitting: Emergency Medicine

## 2014-05-28 ENCOUNTER — Encounter (HOSPITAL_COMMUNITY): Payer: Self-pay | Admitting: Emergency Medicine

## 2014-05-28 ENCOUNTER — Encounter (HOSPITAL_COMMUNITY): Payer: Self-pay | Admitting: *Deleted

## 2014-05-28 DIAGNOSIS — J45909 Unspecified asthma, uncomplicated: Secondary | ICD-10-CM | POA: Insufficient documentation

## 2014-05-28 DIAGNOSIS — Z8742 Personal history of other diseases of the female genital tract: Secondary | ICD-10-CM | POA: Insufficient documentation

## 2014-05-28 DIAGNOSIS — Z8739 Personal history of other diseases of the musculoskeletal system and connective tissue: Secondary | ICD-10-CM | POA: Diagnosis not present

## 2014-05-28 DIAGNOSIS — E663 Overweight: Secondary | ICD-10-CM | POA: Diagnosis not present

## 2014-05-28 DIAGNOSIS — Z8619 Personal history of other infectious and parasitic diseases: Secondary | ICD-10-CM | POA: Insufficient documentation

## 2014-05-28 DIAGNOSIS — K5900 Constipation, unspecified: Secondary | ICD-10-CM | POA: Diagnosis not present

## 2014-05-28 DIAGNOSIS — R1033 Periumbilical pain: Secondary | ICD-10-CM | POA: Diagnosis present

## 2014-05-28 DIAGNOSIS — G8929 Other chronic pain: Secondary | ICD-10-CM | POA: Diagnosis not present

## 2014-05-28 DIAGNOSIS — R1012 Left upper quadrant pain: Secondary | ICD-10-CM | POA: Diagnosis present

## 2014-05-28 DIAGNOSIS — Z3202 Encounter for pregnancy test, result negative: Secondary | ICD-10-CM | POA: Insufficient documentation

## 2014-05-28 DIAGNOSIS — R1013 Epigastric pain: Secondary | ICD-10-CM

## 2014-05-28 HISTORY — DX: Headache: R51

## 2014-05-28 HISTORY — DX: Headache, unspecified: R51.9

## 2014-05-28 HISTORY — DX: Other chronic pain: G89.29

## 2014-05-28 HISTORY — DX: Unspecified abdominal pain: R10.9

## 2014-05-28 LAB — COMPREHENSIVE METABOLIC PANEL
ALT: 17 U/L (ref 0–35)
AST: 18 U/L (ref 0–37)
Albumin: 3.8 g/dL (ref 3.5–5.2)
Alkaline Phosphatase: 78 U/L (ref 39–117)
Anion gap: 7 (ref 5–15)
BUN: 8 mg/dL (ref 6–23)
CO2: 24 mmol/L (ref 19–32)
Calcium: 8.4 mg/dL (ref 8.4–10.5)
Chloride: 108 mmol/L (ref 96–112)
Creatinine, Ser: 0.58 mg/dL (ref 0.50–1.10)
GFR calc Af Amer: >90 mL/min (ref >90)
GFR calc non Af Amer: >90 mL/min (ref >90)
Glucose, Bld: 102 mg/dL — ABNORMAL HIGH (ref 70–99)
Potassium: 3.5 mmol/L (ref 3.5–5.1)
Sodium: 139 mmol/L (ref 135–145)
Total Bilirubin: 0.4 mg/dL (ref 0.3–1.2)
Total Protein: 7.1 g/dL (ref 6.0–8.3)

## 2014-05-28 LAB — URINALYSIS, ROUTINE W REFLEX MICROSCOPIC
Bilirubin Urine: NEGATIVE
Glucose, UA: NEGATIVE mg/dL
Hgb urine dipstick: NEGATIVE
Ketones, ur: NEGATIVE mg/dL
Leukocytes, UA: NEGATIVE
Nitrite: NEGATIVE
Protein, ur: NEGATIVE mg/dL
Specific Gravity, Urine: 1.015 (ref 1.005–1.030)
Urobilinogen, UA: 0.2 mg/dL (ref 0.0–1.0)
pH: 7 (ref 5.0–8.0)

## 2014-05-28 LAB — CBC WITH DIFFERENTIAL/PLATELET
Basophils Absolute: 0 10*3/uL (ref 0.0–0.1)
Basophils Relative: 0 % (ref 0–1)
Eosinophils Absolute: 0.1 10*3/uL (ref 0.0–0.7)
Eosinophils Relative: 2 % (ref 0–5)
HCT: 35.7 % — ABNORMAL LOW (ref 36.0–46.0)
Hemoglobin: 11.9 g/dL — ABNORMAL LOW (ref 12.0–15.0)
Lymphocytes Relative: 21 % (ref 12–46)
Lymphs Abs: 1.3 10*3/uL (ref 0.7–4.0)
MCH: 29.4 pg (ref 26.0–34.0)
MCHC: 33.3 g/dL (ref 30.0–36.0)
MCV: 88.1 fL (ref 78.0–100.0)
Monocytes Absolute: 0.6 10*3/uL (ref 0.1–1.0)
Monocytes Relative: 9 % (ref 3–12)
Neutro Abs: 4.3 10*3/uL (ref 1.7–7.7)
Neutrophils Relative %: 68 % (ref 43–77)
Platelets: 265 10*3/uL (ref 150–400)
RBC: 4.05 MIL/uL (ref 3.87–5.11)
RDW: 13.1 % (ref 11.5–15.5)
WBC: 6.4 10*3/uL (ref 4.0–10.5)

## 2014-05-28 LAB — LIPASE, BLOOD: Lipase: 18 U/L (ref 11–59)

## 2014-05-28 LAB — PREGNANCY, URINE: Preg Test, Ur: NEGATIVE

## 2014-05-28 MED ORDER — HYDROMORPHONE HCL 1 MG/ML IJ SOLN
1.0000 mg | Freq: Once | INTRAMUSCULAR | Status: AC
  Administered 2014-05-28: 1 mg via INTRAMUSCULAR
  Filled 2014-05-28: qty 1

## 2014-05-28 MED ORDER — IOHEXOL 300 MG/ML  SOLN
100.0000 mL | Freq: Once | INTRAMUSCULAR | Status: AC | PRN
  Administered 2014-05-28: 100 mL via INTRAVENOUS

## 2014-05-28 MED ORDER — ONDANSETRON HCL 4 MG/2ML IJ SOLN
4.0000 mg | Freq: Once | INTRAMUSCULAR | Status: AC
Start: 2014-05-28 — End: 2014-05-28
  Administered 2014-05-28: 4 mg via INTRAMUSCULAR
  Filled 2014-05-28: qty 2

## 2014-05-28 MED ORDER — POLYETHYLENE GLYCOL 3350 17 GM/SCOOP PO POWD: ORAL | Status: DC

## 2014-05-28 MED ORDER — IOHEXOL 300 MG/ML  SOLN
50.0000 mL | Freq: Once | INTRAMUSCULAR | Status: AC | PRN
  Administered 2014-05-28: 50 mL via ORAL

## 2014-05-28 MED ORDER — MORPHINE SULFATE 4 MG/ML IJ SOLN
4.0000 mg | Freq: Once | INTRAMUSCULAR | Status: AC
  Administered 2014-05-28: 4 mg via INTRAVENOUS
  Filled 2014-05-28: qty 1

## 2014-05-28 NOTE — ED Notes (Signed)
Discharge instructions given, pt demonstrated teach back and verbal understanding. No concerns voiced.  

## 2014-05-28 NOTE — ED Notes (Signed)
Pt c/o mid abdominal pain that radiates around to her lower back, headache,  and pt states when she eats, she gets sick. Pt denies any other symptoms.

## 2014-05-28 NOTE — ED Provider Notes (Signed)
CSN: [id number]     Arrival date & time 05/28/14  5170 History   First MD Initiated Contact with Patient 05/28/14 [phone]     Chief Complaint  Patient presents with  . Abdominal Pain     (Consider location/radiation/quality/duration/timing/severity/associated sxs/prior Treatment) HPI  This is a 39 year old female who presents with abdominal pain. Patient reports periumbilical pain that radiates to her back onset 3 weeks ago. Worse with eating. Patient describes the pain as a crampy pain. Rates pain at "15 out of 10." She's been taking 800 mg of ibuprofen without any relief. Patient reports associated nausea and nonbilious, nonbloody vomit. Denies any diarrhea. Patient is status post tubal ligation and does not believe she could be pregnant. Reports recent history of urinary tract infection. But denies dysuria. Denies any vaginal discharge.  Last bowel movement yesterday  Past Medical History  Diagnosis Date  . Incompetent cervix   . Menorrhagia   . Asthma   . Trichimoniasis 08/22/2012  . LLQ pain 08/22/2012  . RUQ pain 08/22/2012  . Myalgia 09/10/2012  . RLQ abdominal pain 09/10/2012  . Vaginal discharge 02/26/2013   Past Surgical History  Procedure Laterality Date  . Tonsillectomy    . Ovarian cyst surgery    . Knee surgery Right     lap   . Tubal ligation    . Endometrial ablation      endometrial ablation   Family History  Problem Relation Age of Onset  . Hypertension Mother   . Cancer Maternal Grandmother     breast and lung  . Diabetes Maternal Grandmother   . Diabetes Paternal Grandmother   . Diabetes Paternal Grandfather    History  Substance Use Topics  . Smoking status: Never Smoker   . Smokeless tobacco: Never Used  . Alcohol Use: No   OB History    Gravida Para Term Preterm AB TAB SAB Ectopic Multiple Living   2 2        2      Review of Systems  Constitutional: Negative for fever.  Respiratory: Negative for cough, chest tightness and shortness of breath.    Cardiovascular: Negative for chest pain.  Gastrointestinal: Positive for nausea, vomiting and abdominal pain. Negative for diarrhea and constipation.  Genitourinary: Negative for dysuria, vaginal bleeding and vaginal discharge.  Musculoskeletal: Negative for back pain.  Skin: Negative for rash.  Neurological: Negative for headaches.  All other systems reviewed and are negative.     Allergies  Review of patient's allergies indicates no known allergies.  Home Medications   Prior to Admission medications   Medication Sig Start Date End Date Taking? Authorizing Provider  ibuprofen (ADVIL,MOTRIN) 800 MG tablet Take 800 mg by mouth every 8 (eight) hours as needed for pain.    Historical Provider, MD  polyethylene glycol powder (MIRALAX) powder Take one capful mixed in drink of your choice twice daily until stools are regular and loose in consistency. 05/28/14   Merryl Hacker, MD   BP 127/65 mmHg  Pulse 73  Temp(Src) 98.4 F (36.9 C)  Resp 20  Ht 6\' 1"  (1.854 m)  Wt 308 lb (139.708 kg)  BMI 40.64 kg/m2  SpO2 100% Physical Exam  Constitutional: She is oriented to person, place, and time. She appears well-developed and well-nourished.  Overweight  HENT:  Head: Normocephalic and atraumatic.  Eyes: Pupils are equal, round, and reactive to light.  Cardiovascular: Normal rate, regular rhythm and normal heart sounds.   No murmur heard. Pulmonary/Chest: Effort normal  and breath sounds normal. No respiratory distress. She has no wheezes.  Abdominal: Soft. Bowel sounds are normal. There is tenderness.  Tenderness to palpation periumbilical region and right upper quadrant, no rebound or guarding  Musculoskeletal: She exhibits no edema.  Neurological: She is alert and oriented to person, place, and time.  Skin: Skin is warm and dry.  Psychiatric: She has a normal mood and affect.  Nursing note and vitals reviewed.   ED Course  Procedures (including critical care time) Labs  Review Labs Reviewed  CBC WITH DIFFERENTIAL/PLATELET - Abnormal; Notable for the following:    Hemoglobin 11.9 (*)    HCT 35.7 (*)    All other components within normal limits  COMPREHENSIVE METABOLIC PANEL - Abnormal; Notable for the following:    Glucose, Bld 102 (*)    All other components within normal limits  PREGNANCY, URINE  URINALYSIS, ROUTINE W REFLEX MICROSCOPIC  LIPASE, BLOOD    Imaging Review Ct Abdomen Pelvis W Contrast  05/28/2014   CLINICAL DATA:  Mid abdominal pain for 1 month radiating to low back. Headache. Nausea after eating.  EXAM: CT ABDOMEN AND PELVIS WITH CONTRAST  TECHNIQUE: Multidetector CT imaging of the abdomen and pelvis was performed using the standard protocol following bolus administration of intravenous contrast.  CONTRAST:  68mL OMNIPAQUE IOHEXOL 300 MG/ML SOLN, 165mL OMNIPAQUE IOHEXOL 300 MG/ML SOLN  COMPARISON:  None.  FINDINGS: LUNG BASES: Included view of the lung bases are clear. Visualized heart and pericardium are unremarkable.  SOLID ORGANS: The liver, spleen, gallbladder, pancreas and adrenal glands are unremarkable.  GASTROINTESTINAL TRACT: The stomach, small and large bowel are normal in course and caliber without inflammatory changes. Status post appendectomy. Enteric contrast has not yet reached the distal small bowel. Mild amount of retained large bowel stool.  KIDNEYS/ URINARY TRACT: Kidneys are orthotopic, demonstrating symmetric enhancement. No nephrolithiasis, hydronephrosis or solid renal masses. The unopacified ureters are normal in course and caliber. Urinary bladder is partially distended and unremarkable.  PERITONEUM/RETROPERITONEUM: Aortoiliac vessels are normal in course and caliber. No lymphadenopathy by CT size criteria. Small mesenteric lymph nodes are likely reactive. Internal reproductive organs are unremarkable. No intraperitoneal free fluid nor free air. Phleboliths in the pelvis.  SOFT TISSUE/OSSEOUS STRUCTURES: Non-suspicious.  Moderate lower lumbar facet arthropathy. Small fat containing umbilical hernia.  IMPRESSION: Mild amount of retained large bowel stool without bowel obstruction or acute intra-abdominal/ pelvic process.   Electronically Signed   By: Elon Alas   On: 05/28/2014 06:21     EKG Interpretation None      MDM   Final diagnoses:  Epigastric pain  Constipation, unspecified constipation type   Patient presents with epigastric abdominal pain and vomiting.  Tender on exam, no signs of peritonitis. Otherwise nontoxic. Basic labwork obtained and reassuring. Patient given pain and nausea medicine. Will obtain CT scan to rule out obstruction given vomiting associated with abdominal pain. CT shows no evidence of obstruction but does show some evidence of constipation. On reexam, patient is now more comfortable appearing. She reports that her bowel movements are irregular. She does report an urge to go but does not pass much stool.  Will place on a regimen of Miralax twice daily. Patient stated understanding.  After history, exam, and medical workup I feel the patient has been appropriately medically screened and is safe for discharge home. Pertinent diagnoses were discussed with the patient. Patient was given return precautions.     Merryl Hacker, MD 05/28/14 [phone]

## 2014-05-28 NOTE — ED Notes (Signed)
Pt reports was evaluated here this am for abd pain.  Reports was sent home with miralax.  Pt says has been taking it but still no BM and abd pain is worse.  Reports n/v.

## 2014-05-28 NOTE — Discharge Instructions (Signed)
Constipation Constipation is when a person:  Poops (has a bowel movement) less than 3 times a week.  Has a hard time pooping.  Has poop that is dry, hard, or bigger than normal. HOME CARE   Eat foods with a lot of fiber in them. This includes fruits, vegetables, beans, and whole grains such as brown rice.  Avoid fatty foods and foods with a lot of sugar. This includes french fries, hamburgers, cookies, candy, and soda.  If you are not getting enough fiber from food, take products with added fiber in them (supplements).  Drink enough fluid to keep your pee (urine) clear or pale yellow.  Exercise on a regular basis, or as told by your doctor.  Go to the restroom when you feel like you need to poop. Do not hold it.  Only take medicine as told by your doctor. Do not take medicines that help you poop (laxatives) without talking to your doctor first. GET HELP RIGHT AWAY IF:   You have bright red blood in your poop (stool).  Your constipation lasts more than 4 days or gets worse.  You have belly (abdominal) or butt (rectal) pain.  You have thin poop (as thin as a pencil).  You lose weight, and it cannot be explained. MAKE SURE YOU:   Understand these instructions.  Will watch your condition.  Will get help right away if you are not doing well or get worse. Document Released: 07/13/2007 Document Revised: 01/29/2013 Document Reviewed: 11/05/2012 [facility] Patient Information 2015 [place], [place]. This information is not intended to replace advice given to you by your health care provider. Make sure you discuss any questions you have with your health care provider.   Increase fluids, fruits, fibers.    Other recommendations include Miralax, Dulcolax, magnesium citrate, fleets enema. Follow-up your primary care doctor or return if worse

## 2014-05-28 NOTE — Discharge Instructions (Signed)

## 2014-05-28 NOTE — ED Provider Notes (Signed)
CSN: [id number]     Arrival date & time 05/28/14  1520 History   First MD Initiated Contact with Patient 05/28/14 1651     Chief Complaint  Patient presents with  . Abdominal Pain     (Consider location/radiation/quality/duration/timing/severity/associated sxs/prior Treatment) HPI.... Patient returns with a chief complaint of continued abdominal pain. Pain appears to be located in the left upper quadrant and left lower quadrant. She is able to eat. Bowel movements have been variable. Evaluation several hours previously in the emergency department showed a negative CT scan of A/P. She is afebrile. No dysuria, chest pain, dyspnea.  Past Medical History  Diagnosis Date  . Incompetent cervix   . Menorrhagia   . Asthma   . Trichimoniasis 08/22/2012  . LLQ pain 08/22/2012  . RUQ pain 08/22/2012  . Myalgia 09/10/2012  . RLQ abdominal pain 09/10/2012  . Vaginal discharge 02/26/2013  . Chronic abdominal pain   . Chronic headache    Past Surgical History  Procedure Laterality Date  . Tonsillectomy    . Ovarian cyst surgery    . Knee surgery Right     lap   . Tubal ligation    . Endometrial ablation      endometrial ablation   Family History  Problem Relation Age of Onset  . Hypertension Mother   . Cancer Maternal Grandmother     breast and lung  . Diabetes Maternal Grandmother   . Diabetes Paternal Grandmother   . Diabetes Paternal Grandfather    History  Substance Use Topics  . Smoking status: Never Smoker   . Smokeless tobacco: Never Used  . Alcohol Use: No   OB History    Gravida Para Term Preterm AB TAB SAB Ectopic Multiple Living   2 2        2      Review of Systems  All other systems reviewed and are negative.     Allergies  Review of patient's allergies indicates no known allergies.  Home Medications   Prior to Admission medications   Medication Sig Start Date End Date Taking? Authorizing Provider  alum & mag hydroxide-simeth (MAALOX/MYLANTA) 200-200-20  MG/5ML suspension Take 30 mLs by mouth every 6 (six) hours as needed for indigestion or heartburn.   Yes Historical Provider, MD  ibuprofen (ADVIL,MOTRIN) 800 MG tablet Take 800 mg by mouth every 8 (eight) hours as needed for pain.    Historical Provider, MD  polyethylene glycol powder (MIRALAX) powder Take one capful mixed in drink of your choice twice daily until stools are regular and loose in consistency. Patient taking differently: Take 1 Container by mouth daily. Take one capful mixed in drink of your choice twice daily until stools are regular and loose in consistency. 05/28/14   Merryl Hacker, MD   BP 119/64 mmHg  Pulse 76  Temp(Src) 98.9 F (37.2 C) (Oral)  Resp 20  Ht 6\' 1"  (1.854 m)  Wt 308 lb (139.708 kg)  BMI 40.64 kg/m2  SpO2 99% Physical Exam  Constitutional: She is oriented to person, place, and time. She appears well-developed and well-nourished.  HENT:  Head: Normocephalic and atraumatic.  Eyes: Conjunctivae and EOM are normal. Pupils are equal, round, and reactive to light.  Neck: Normal range of motion. Neck supple.  Cardiovascular: Normal rate and regular rhythm.   Pulmonary/Chest: Effort normal and breath sounds normal.  Minimal left mid abdominal tenderness  Abdominal: Soft. Bowel sounds are normal.  Musculoskeletal: Normal range of motion.  Neurological: She is  alert and oriented to person, place, and time.  Skin: Skin is warm and dry.  Psychiatric: She has a normal mood and affect. Her behavior is normal.  Nursing note and vitals reviewed.   ED Course  Procedures (including critical care time) Labs Review Labs Reviewed - No data to display  Imaging Review Ct Abdomen Pelvis W Contrast  05/28/2014   CLINICAL DATA:  Mid abdominal pain for 1 month radiating to low back. Headache. Nausea after eating.  EXAM: CT ABDOMEN AND PELVIS WITH CONTRAST  TECHNIQUE: Multidetector CT imaging of the abdomen and pelvis was performed using the standard protocol following  bolus administration of intravenous contrast.  CONTRAST:  26mL OMNIPAQUE IOHEXOL 300 MG/ML SOLN, 136mL OMNIPAQUE IOHEXOL 300 MG/ML SOLN  COMPARISON:  None.  FINDINGS: LUNG BASES: Included view of the lung bases are clear. Visualized heart and pericardium are unremarkable.  SOLID ORGANS: The liver, spleen, gallbladder, pancreas and adrenal glands are unremarkable.  GASTROINTESTINAL TRACT: The stomach, small and large bowel are normal in course and caliber without inflammatory changes. Status post appendectomy. Enteric contrast has not yet reached the distal small bowel. Mild amount of retained large bowel stool.  KIDNEYS/ URINARY TRACT: Kidneys are orthotopic, demonstrating symmetric enhancement. No nephrolithiasis, hydronephrosis or solid renal masses. The unopacified ureters are normal in course and caliber. Urinary bladder is partially distended and unremarkable.  PERITONEUM/RETROPERITONEUM: Aortoiliac vessels are normal in course and caliber. No lymphadenopathy by CT size criteria. Small mesenteric lymph nodes are likely reactive. Internal reproductive organs are unremarkable. No intraperitoneal free fluid nor free air. Phleboliths in the pelvis.  SOFT TISSUE/OSSEOUS STRUCTURES: Non-suspicious. Moderate lower lumbar facet arthropathy. Small fat containing umbilical hernia.  IMPRESSION: Mild amount of retained large bowel stool without bowel obstruction or acute intra-abdominal/ pelvic process.   Electronically Signed   By: Elon Alas   On: 05/28/2014 06:21   Dg Abd Acute W/chest  05/28/2014   CLINICAL DATA:  Right-sided anterior abdominal pain in chest pain.  EXAM: DG ABDOMEN ACUTE W/ 1V CHEST  COMPARISON:  CT same day  FINDINGS: Bowel gas pattern does not show any evidence of ileus, obstruction or free air. Contrast administered for CT is now present throughout the colon. No worrisome calcifications or bony findings.  One-view chest shows normal heart and mediastinal shadows. The lungs are clear. No  free air.  IMPRESSION: Negative acute abdominal series. Contrast previously administered for CT has passed throughout the colon.   Electronically Signed   By: Nelson Chimes M.D.   On: 05/28/2014 18:59     EKG Interpretation None      MDM   Final diagnoses:  Constipation    No acute abdomen. CT scan revealed from several hours previously. Scan showed mild amount retained large bowel stool without obstruction or acute abdominal or pelvic process. She is hemodynamically stable at this time. Vital signs are normal. Recommended strategies to correct her constipation.    Nat Christen, MD 05/31/14 1329

## 2014-05-31 NOTE — Op Note (Signed)
PATIENT NAME:  Marie Morales, Marie Morales MR#:  [id number] DATE OF BIRTH:  06-05-1975  DATE OF PROCEDURE:  12/10/2013  PREOPERATIVE DIAGNOSES:  1.  Severe dysmenorrhea following prior NovaSure endometrial ablation.  2.  Uterine perforation.   POSTOPERATIVE DIAGNOSES: 1.  Severe dysmenorrhea following prior NovaSure endometrial ablation. 2.  Uterine perforation. 3.  Endometriosis.   OPERATION PERFORMED: Hysteroscopy, dilatation and curettage as well as diagnostic laparoscopy.   ANESTHESIA USED: General.   PRIMARY SURGEON: Dorthula Nettles, MD   ESTIMATED BLOOD LOSS: 20 mL.  OPERATIVE FLUIDS: One liter.   URINE OUTPUT: Clear urine 50 mL.   PREOPERATIVE ANTIBIOTICS: None. Two grams of Ancef was administered at the time of perforation.   DRAINS OR TUBES: None.   IMPLANTS: None.   COMPLICATIONS: Uterine perforation posterior fundus.   FINDINGS: Significant scarring pattern in the uterine cavity from prior ablation, no evidence of hematometra. On D and C, no resistance felt after placing the curette, which prompted a second look hysteroscopy, which confirmed a posterior fundal perforation. Diagnostic laparoscopy revealed a 0.5 cm fundal perforation which was hemostatic. Arista powder was prophylactically applied to the perforation. The bowel and descending colon were inspected and noted to be free of injury. Note was made of several implants in the posterior cul-de-sac which appeared consistent with endometriosis.   SPECIMENS REMOVED: Endometrial curettings.   CONDITION: The patient condition following procedure stable.   PROCEDURE IN DETAIL: Risks, benefits and alternatives to the procedure were discussed with the patient prior to proceeding to the operating room. The patient was taken to the operating room where she was placed under general endotracheal anesthesia. She was positioned in the dorsal lithotomy position, prepped and draped in the usual sterile fashion. A timeout was  performed. Attention was turned to the patient's pelvis. The bladder was straight catheterized with a red rubber catheter for 50 mL of clear urine. An operative speculum was placed. The anterior lip of the cervix was visualized, grasped with a single-tooth tenaculum and sequentially dilated using Pratt dilators. Hysteroscope was then used to advance into the uterine cavity. Cervix was normal. The uterine cavity displayed a significant amount of uterine synechiae consistent with the patient's prior history of endometrial ablation. There was no hematometra encountered during the course of the procedure. The hysteroscope was removed, sharp curettage was attempted with a small serrated curette. No resistance was encountered after placement of the curette, which prompted a second look hysteroscopy and confirmed a posterior fundal perforation, which was midline. Given the perforation, a decision was made to proceed with diagnostic laparoscopy and verify there was no intra-abdominal injury to the colon or other structures. The patient was reprepped and redraped. A 5 mm stab incision was made at the umbilicus after infiltrating the umbilicus with 4 mL of 1% lidocaine. An XL trocar was used to gain direct entry into the abdomen under visualization. Pneumoperitoneum was established. The hysteroscopy fluid was removed using a suction irrigator. Evaluation of the pelvis revealed the above findings. The perforation was noted to be hemostatic, fundal posterior midline. A second 5 mm XL trocar was placed left lower quadrant under direct visualization to allow manipulation. One gram of Arista powder was applied to the perforation to ensure that it remained hemostatic. Pneumoperitoneum was evacuated. The trocar sites were dressed with Dermabond. Sponge, needle and instrument counts were correct x 2. The patient tolerated the procedure well and was taken to the recovery room in stable condition.      ____________________________ Stoney Bang. Georgianne Fick,  MD ams:TT D: 12/10/2013 12:26:58 ET T: 12/10/2013 21:37:35 ET JOB#: [id number]  cc: Stoney Bang. Georgianne Fick, MD, <Dictator> Conan Bowens Madelon Lips MD ELECTRONICALLY SIGNED 12/11/2013 7:51

## 2014-06-02 LAB — SURGICAL PATHOLOGY

## 2014-07-17 ENCOUNTER — Telehealth: Payer: Self-pay | Admitting: *Deleted

## 2014-07-17 ENCOUNTER — Encounter: Payer: Self-pay | Admitting: Obstetrics & Gynecology

## 2014-07-17 ENCOUNTER — Ambulatory Visit (INDEPENDENT_AMBULATORY_CARE_PROVIDER_SITE_OTHER): Payer: BC Managed Care – PPO | Admitting: Obstetrics & Gynecology

## 2014-07-17 VITALS — BP 110/80 | HR 68 | Temp 98.4°F | Ht 73.0 in | Wt 283.0 lb

## 2014-07-17 DIAGNOSIS — N760 Acute vaginitis: Secondary | ICD-10-CM | POA: Diagnosis not present

## 2014-07-17 DIAGNOSIS — N72 Inflammatory disease of cervix uteri: Secondary | ICD-10-CM | POA: Diagnosis not present

## 2014-07-17 DIAGNOSIS — R350 Frequency of micturition: Secondary | ICD-10-CM | POA: Diagnosis not present

## 2014-07-17 MED ORDER — DOXYCYCLINE HYCLATE 50 MG PO CAPS: 100.0000 mg | ORAL_CAPSULE | Freq: Two times a day (BID) | ORAL | Status: DC

## 2014-07-17 NOTE — Telephone Encounter (Signed)
Pt states was given Doxycycline at her appt today,always get yeast infection when on ABX.  Requesting Rx for Diflucan.

## 2014-07-17 NOTE — Progress Notes (Signed)
Patient ID: Marie Morales, female   DOB: 08/01/1975, 39 y.o.   MRN: [id number]    Chief Complaint  Patient presents with  . gyn visit    c/c frequent urination/ odor / white vaginal discharge.took azo.     HPI:    39 y.o. G2P2 No LMP recorded. Patient has had an ablation.  Irritation vaginal discharge itching some odor     Current outpatient prescriptions:  .  medroxyPROGESTERone (DEPO-PROVERA) 150 MG/ML injection, Inject 150 mg into the muscle every 3 (three) months., Disp: , Rfl:  .  alum & mag hydroxide-simeth (MAALOX/MYLANTA) 200-200-20 MG/5ML suspension, Take 30 mLs by mouth every 6 (six) hours as needed for indigestion or heartburn., Disp: , Rfl:  .  ibuprofen (ADVIL,MOTRIN) 800 MG tablet, Take 800 mg by mouth every 8 (eight) hours as needed for pain., Disp: , Rfl:  .  polyethylene glycol powder (MIRALAX) powder, Take one capful mixed in drink of your choice twice daily until stools are regular and loose in consistency. (Patient not taking: Reported on 07/17/2014), Disp: 255 g, Rfl: 0  Problem Pertinent ROS:       Extended ROS:        Sabinal:             Past Medical History  Diagnosis Date  . Incompetent cervix   . Menorrhagia   . Asthma   . Trichimoniasis 08/22/2012  . LLQ pain 08/22/2012  . RUQ pain 08/22/2012  . Myalgia 09/10/2012  . RLQ abdominal pain 09/10/2012  . Vaginal discharge 02/26/2013  . Chronic abdominal pain   . Chronic headache     Past Surgical History  Procedure Laterality Date  . Tonsillectomy    . Ovarian cyst surgery    . Knee surgery Right     lap   . Tubal ligation    . Endometrial ablation      endometrial ablation    OB History    Gravida Para Term Preterm AB TAB SAB Ectopic Multiple Living   2 2        2       No Known Allergies  History   Social History  . Marital Status: Married    Spouse Name: N/A  . Number of Children: N/A  . Years of Education: N/A   Social History Main Topics  . Smoking status: Never Smoker   .  Smokeless tobacco: Never Used  . Alcohol Use: No  . Drug Use: No  . Sexual Activity: Yes    Birth Control/ Protection: Surgical   Other Topics Concern  . None   Social History Narrative    Family History  Problem Relation Age of Onset  . Hypertension Mother   . Cancer Maternal Grandmother     breast and lung  . Diabetes Maternal Grandmother   . Diabetes Paternal Grandmother   . Diabetes Paternal Grandfather      Examination:  Vitals:  Blood pressure 110/80, pulse 68, temperature 98.4 F (36.9 C), height 6\' 1"  (1.854 m), weight 283 lb (128.368 kg).    Physical Examination:       Vulva:  normal appearing vulva with no masses, tenderness or lesions Vagina:  normal mucosa, thin grey discharge Cervix:  no cervical motion tenderness and no lesions Uterus:  normal size, contour, position, consistency, mobility, non-tender Adnexa: ovaries:,         Impression/Plan(Problem Based): 1.  No specific vaginitis/cervicitis       2.  Episodic pelvic pain associated with  menses, post ablation will try depo provera   Follow Up:   1  months     Face to face time:   minutes  Greater than 50% of the visit time was spent in counseling and coordination of care with the patient.  The summary and outline of the counseling and care coordination is summarized in the note above.   All questions were answered.  Meds ordered this encounter  Medications  . medroxyPROGESTERone (DEPO-PROVERA) 150 MG/ML injection    Sig: Inject 150 mg into the muscle every 3 (three) months.  . doxycycline (VIBRAMYCIN) 50 MG capsule    Sig: Take 2 capsules (100 mg total) by mouth 2 (two) times daily.    Dispense:  40 capsule    Refill:  0

## 2014-07-18 ENCOUNTER — Telehealth: Payer: Self-pay | Admitting: Obstetrics & Gynecology

## 2014-07-18 MED ORDER — FLUCONAZOLE 150 MG PO TABS: 150.0000 mg | ORAL_TABLET | Freq: Once | ORAL | Status: DC

## 2014-07-19 LAB — URINE CULTURE

## 2014-08-14 ENCOUNTER — Ambulatory Visit (INDEPENDENT_AMBULATORY_CARE_PROVIDER_SITE_OTHER): Payer: BC Managed Care – PPO | Admitting: Obstetrics & Gynecology

## 2014-08-14 VITALS — BP 102/60 | HR 72 | Wt 295.0 lb

## 2014-08-14 DIAGNOSIS — N72 Inflammatory disease of cervix uteri: Secondary | ICD-10-CM | POA: Diagnosis not present

## 2014-08-14 MED ORDER — FLUCONAZOLE 150 MG PO TABS: 150.0000 mg | ORAL_TABLET | Freq: Once | ORAL | Status: DC

## 2014-08-14 NOTE — Progress Notes (Signed)
Patient ID: Marie Morales, female   DOB: 02-03-1976, 39 y.o.   MRN: [id number]  No LMP recorded. Patient has had an ablation.  Pt in for recheck of her cervicitis treated with doxycycline  Pt states her discharge and symptoms have iresolved  Wet Prep:   A sample of vaginal discharge was obtained from the posterior fornix using a cotton swab. 2 drops of saline were placed on a slide and the cotton swab was immersed in the saline. Microscopic evaluation was performed and results were as follows:  Negative  for yeast  Negative for clue cells , consistent with Bacterial vaginosis Negative for trichomonas  Normal WBC population   Whiff test: Negative  No further therapy needed  No follow up needed for this issue  Pt considering surgery for RLQ discomfort and dyspareunia

## 2014-10-16 ENCOUNTER — Encounter: Payer: Self-pay | Admitting: Obstetrics & Gynecology

## 2014-10-16 ENCOUNTER — Ambulatory Visit (INDEPENDENT_AMBULATORY_CARE_PROVIDER_SITE_OTHER): Payer: BC Managed Care – PPO | Admitting: Obstetrics & Gynecology

## 2014-10-16 VITALS — BP 110/80 | HR 84 | Wt 288.0 lb

## 2014-10-16 DIAGNOSIS — N760 Acute vaginitis: Secondary | ICD-10-CM

## 2014-10-16 DIAGNOSIS — R8290 Unspecified abnormal findings in urine: Secondary | ICD-10-CM | POA: Diagnosis not present

## 2014-10-16 DIAGNOSIS — A499 Bacterial infection, unspecified: Secondary | ICD-10-CM

## 2014-10-16 DIAGNOSIS — B9689 Other specified bacterial agents as the cause of diseases classified elsewhere: Secondary | ICD-10-CM

## 2014-10-16 LAB — POCT URINALYSIS DIPSTICK
Blood, UA: NEGATIVE
Glucose, UA: NEGATIVE
Ketones, UA: NEGATIVE
Nitrite, UA: NEGATIVE
Protein, UA: NEGATIVE

## 2014-10-16 MED ORDER — SILVER SULFADIAZINE 1 % EX CREA: TOPICAL_CREAM | CUTANEOUS | Status: DC

## 2014-10-16 MED ORDER — METRONIDAZOLE 500 MG PO TABS: 500.0000 mg | ORAL_TABLET | Freq: Two times a day (BID) | ORAL | Status: DC

## 2014-10-16 MED ORDER — IBUPROFEN 800 MG PO TABS: 800.0000 mg | ORAL_TABLET | Freq: Three times a day (TID) | ORAL | Status: DC | PRN

## 2014-10-16 NOTE — Progress Notes (Signed)
Patient ID: Marie Morales, female   DOB: 10-23-1975, 39 y.o.   MRN: [id number] Chief Complaint  Patient presents with  . gyn visit    urine has odor/ vaginal discharge/ itching.    Blood pressure 110/80, pulse 84, weight 288 lb (130.636 kg).  39 y.o. G2P2 No LMP recorded. Patient has had an ablation. The current method of family planning is tubal ligation and endometrial ablation.  Subjective Pt with 2 week history of vagianl irritation, slight occasional odor, some discharge a little bit of itching Antibiotics 2 months ago given diflucan as well No sex since 05/2014  Objective Vulva:  normal appearing vulva with no masses, tenderness or lesions Vagina:  normal mucosa, thin grey discharge Cervix:  no cervical motion tenderness and no lesions Uterus:  normal size, contour, position, consistency, mobility, non-tender Adnexa: ovaries:present,  Non tender  Wet Prep:   A sample of vaginal discharge was obtained from the posterior fornix using a cotton swab. 2 drops of saline were placed on a slide and the cotton swab was immersed in the saline. Microscopic evaluation was performed and results were as follows:  Negative  for yeast  Positive for clue cells , consistent with Bacterial vaginosis Negative for trichomonas  Normal WBC population   Whiff test: Positive   Pertinent ROS No burning with urination, frequency or urgency No nausea, vomiting or diarrhea Nor fever chills or other constitutional symptoms   Labs or studies Urinalysis negative nitrates    Impression Diagnoses this Encounter::   ICD-9-CM ICD-10-CM   1. BV (bacterial vaginosis) 616.10 N76.0    041.9 A49.9   2. Bad odor of urine 791.9 R82.90 POCT urinalysis dipstick    Established relevant diagnosis(es): History of trichomonas Recurrent right thigh boil  Plan/Recommendations: Meds ordered this encounter  Medications  . silver sulfADIAZINE (SILVADENE) 1 % cream    Sig: Use to area TID    Dispense:   50 g    Refill:  11  . metroNIDAZOLE (FLAGYL) 500 MG tablet    Sig: Take 1 tablet (500 mg total) by mouth 2 (two) times daily.    Dispense:  14 tablet    Refill:  0  . ibuprofen (ADVIL,MOTRIN) 800 MG tablet    Sig: Take 1 tablet (800 mg total) by mouth every 8 (eight) hours as needed.    Dispense:  30 tablet    Refill:  3   femdophilus is recommended as well, amazon information given  Labs or Scans Ordered: Orders Placed This Encounter  Procedures  . POCT urinalysis dipstick      Follow up Return if symptoms worsen or fail to improve, for Follow up, with Dr Elonda Husky.      All questions were answered.

## 2014-11-10 ENCOUNTER — Inpatient Hospital Stay: Admission: RE | Admit: 2014-11-10 | Payer: BC Managed Care – PPO | Source: Ambulatory Visit

## 2014-11-11 ENCOUNTER — Encounter
Admission: RE | Admit: 2014-11-11 | Discharge: 2014-11-11 | Disposition: A | Discharge status: Home / Self Care | Payer: BC Managed Care – PPO | Source: Ambulatory Visit | Attending: Obstetrics and Gynecology | Admitting: Obstetrics and Gynecology

## 2014-11-11 DIAGNOSIS — Z885 Allergy status to narcotic agent status: Secondary | ICD-10-CM | POA: Diagnosis not present

## 2014-11-11 DIAGNOSIS — J45909 Unspecified asthma, uncomplicated: Secondary | ICD-10-CM | POA: Diagnosis not present

## 2014-11-11 DIAGNOSIS — R102 Pelvic and perineal pain: Secondary | ICD-10-CM | POA: Diagnosis not present

## 2014-11-11 DIAGNOSIS — N809 Endometriosis, unspecified: Secondary | ICD-10-CM | POA: Diagnosis not present

## 2014-11-11 DIAGNOSIS — Z833 Family history of diabetes mellitus: Secondary | ICD-10-CM | POA: Diagnosis not present

## 2014-11-11 DIAGNOSIS — N72 Inflammatory disease of cervix uteri: Secondary | ICD-10-CM | POA: Diagnosis not present

## 2014-11-11 DIAGNOSIS — D259 Leiomyoma of uterus, unspecified: Secondary | ICD-10-CM | POA: Diagnosis not present

## 2014-11-11 DIAGNOSIS — G8929 Other chronic pain: Secondary | ICD-10-CM | POA: Diagnosis not present

## 2014-11-11 DIAGNOSIS — Z79899 Other long term (current) drug therapy: Secondary | ICD-10-CM | POA: Diagnosis not present

## 2014-11-11 HISTORY — DX: Unspecified osteoarthritis, unspecified site: M19.90

## 2014-11-11 HISTORY — DX: Localized edema: R60.0

## 2014-11-11 LAB — BASIC METABOLIC PANEL
Anion gap: 12 (ref 5–15)
BUN: 9 mg/dL (ref 6–20)
CO2: 21 mmol/L — ABNORMAL LOW (ref 22–32)
Calcium: 9.4 mg/dL (ref 8.9–10.3)
Chloride: 106 mmol/L (ref 101–111)
Creatinine, Ser: 0.87 mg/dL (ref 0.44–1.00)
GFR calc Af Amer: >60 mL/min (ref >60)
GFR calc non Af Amer: >60 mL/min (ref >60)
Glucose, Bld: 84 mg/dL (ref 65–99)
Potassium: 4.1 mmol/L (ref 3.5–5.1)
Sodium: 139 mmol/L (ref 135–145)

## 2014-11-11 LAB — CBC
HCT: 39.2 % (ref 35.0–47.0)
Hemoglobin: 12.7 g/dL (ref 12.0–16.0)
MCH: 28.6 pg (ref 26.0–34.0)
MCHC: 32.4 g/dL (ref 32.0–36.0)
MCV: 88.3 fL (ref 80.0–100.0)
Platelets: 305 10*3/uL (ref 150–440)
RBC: 4.43 MIL/uL (ref 3.80–5.20)
RDW: 13.8 % (ref 11.5–14.5)
WBC: 7.1 10*3/uL (ref 3.6–11.0)

## 2014-11-11 LAB — TYPE AND SCREEN
ABO/RH(D): O POS
Antibody Screen: NEGATIVE

## 2014-11-11 LAB — ABO/RH: ABO/RH(D): O POS

## 2014-11-11 NOTE — Patient Instructions (Signed)
  Your procedure is scheduled on November 13, 2014 (Thursday) Report to Day Surgery.[facility]) To find out your arrival time please call [phone] between 1PM - 3PM on November 12, 2014 (Wednesday).  Remember: Instructions that are not followed completely may result in serious medical risk, up to and including death, or upon the discretion of your surgeon and anesthesiologist your surgery may need to be rescheduled.    __x__ 1. Do not eat food or drink liquids after midnight. No gum chewing or hard candies.     ____ 2. No Alcohol for 24 hours before or after surgery.   ____ 3. Bring all medications with you on the day of surgery if instructed.    __x__ 4. Notify your doctor if there is any change in your medical condition     (cold, fever, infections).     Do not wear jewelry, make-up, hairpins, clips or nail polish.  Do not wear lotions, powders, or perfumes. You may wear deodorant.  Do not shave 48 hours prior to surgery. Men may shave face and neck.  Do not bring valuables to the hospital.    [facility] is not responsible for any belongings or valuables.               Contacts, dentures or bridgework may not be worn into surgery.  Leave your suitcase in the car. After surgery it may be brought to your room.  For patients admitted to the hospital, discharge time is determined by your                treatment team.   Patients discharged the day of surgery will not be allowed to drive home.   Please read over the following fact sheets that you were given:   Surgical Site Infection Prevention   ____ Take these medicines the morning of surgery with A SIP OF WATER:    1.   2.   3.   4.  5.  6.  ____ Fleet Enema (as directed)   _x___ Use CHG Soap as directed  ____ Use inhalers on the day of surgery  ____ Stop metformin 2 days prior to surgery    ____ Take 1/2 of usual insulin dose the night before surgery and none on the morning of surgery.   ____ Stop  Coumadin/Plavix/aspirin on   __x__ Stop Anti-inflammatories on (Stop Ibuprofen now , Tylenol ok to take for pain if needed)   ____ Stop supplements until after surgery.    ____ Bring C-Pap to the hospital.

## 2014-11-13 ENCOUNTER — Ambulatory Visit
Admission: RE | Admit: 2014-11-13 | Payer: BC Managed Care – PPO | Source: Ambulatory Visit | Admitting: Obstetrics and Gynecology

## 2014-11-13 ENCOUNTER — Encounter: Payer: Self-pay | Admitting: *Deleted

## 2014-11-13 ENCOUNTER — Ambulatory Visit: Payer: BC Managed Care – PPO | Admitting: Anesthesiology

## 2014-11-13 ENCOUNTER — Observation Stay
Admission: RE | Admit: 2014-11-13 | Discharge: 2014-11-14 | Disposition: A | Discharge status: Home / Self Care | Payer: BC Managed Care – PPO | Source: Ambulatory Visit | Attending: Obstetrics and Gynecology | Admitting: Obstetrics and Gynecology

## 2014-11-13 ENCOUNTER — Encounter
Admission: RE | Disposition: A | Discharge status: Home / Self Care | Payer: Self-pay | Source: Ambulatory Visit | Attending: Obstetrics and Gynecology

## 2014-11-13 ENCOUNTER — Encounter: Admission: RE | Payer: Self-pay | Source: Ambulatory Visit

## 2014-11-13 DIAGNOSIS — Z79899 Other long term (current) drug therapy: Secondary | ICD-10-CM | POA: Insufficient documentation

## 2014-11-13 DIAGNOSIS — Z885 Allergy status to narcotic agent status: Secondary | ICD-10-CM | POA: Insufficient documentation

## 2014-11-13 DIAGNOSIS — N809 Endometriosis, unspecified: Secondary | ICD-10-CM | POA: Diagnosis not present

## 2014-11-13 DIAGNOSIS — N72 Inflammatory disease of cervix uteri: Secondary | ICD-10-CM | POA: Insufficient documentation

## 2014-11-13 DIAGNOSIS — D259 Leiomyoma of uterus, unspecified: Secondary | ICD-10-CM | POA: Insufficient documentation

## 2014-11-13 DIAGNOSIS — G8929 Other chronic pain: Secondary | ICD-10-CM | POA: Insufficient documentation

## 2014-11-13 DIAGNOSIS — Z90710 Acquired absence of both cervix and uterus: Secondary | ICD-10-CM | POA: Diagnosis present

## 2014-11-13 DIAGNOSIS — R102 Pelvic and perineal pain: Secondary | ICD-10-CM | POA: Insufficient documentation

## 2014-11-13 DIAGNOSIS — J45909 Unspecified asthma, uncomplicated: Secondary | ICD-10-CM | POA: Insufficient documentation

## 2014-11-13 DIAGNOSIS — Z833 Family history of diabetes mellitus: Secondary | ICD-10-CM | POA: Insufficient documentation

## 2014-11-13 HISTORY — PX: CYSTOSCOPY: SHX5120

## 2014-11-13 HISTORY — PX: LAPAROSCOPIC HYSTERECTOMY: SHX1926

## 2014-11-13 LAB — POCT PREGNANCY, URINE: Preg Test, Ur: NEGATIVE

## 2014-11-13 SURGERY — HYSTERECTOMY, TOTAL, LAPAROSCOPIC
Anesthesia: Choice

## 2014-11-13 SURGERY — HYSTERECTOMY, TOTAL, LAPAROSCOPIC
Anesthesia: General

## 2014-11-13 MED ORDER — ONDANSETRON HCL 4 MG/2ML IJ SOLN
4.0000 mg | Freq: Four times a day (QID) | INTRAMUSCULAR | Status: DC | PRN
  Administered 2014-11-13 – 2014-11-14 (×2): 4 mg via INTRAVENOUS
  Filled 2014-11-13 (×2): qty 2

## 2014-11-13 MED ORDER — ONDANSETRON HCL 4 MG/2ML IJ SOLN
INTRAMUSCULAR | Status: DC | PRN
  Administered 2014-11-13: 4 mg via INTRAVENOUS

## 2014-11-13 MED ORDER — ONDANSETRON HCL 4 MG PO TABS: 4.0000 mg | ORAL_TABLET | Freq: Four times a day (QID) | ORAL | Status: DC | PRN

## 2014-11-13 MED ORDER — PROPOFOL 10 MG/ML IV BOLUS
INTRAVENOUS | Status: DC | PRN
  Administered 2014-11-13: 200 mg via INTRAVENOUS

## 2014-11-13 MED ORDER — LIDOCAINE HCL (CARDIAC) 20 MG/ML IV SOLN
INTRAVENOUS | Status: DC | PRN
  Administered 2014-11-13: 50 mg via INTRAVENOUS

## 2014-11-13 MED ORDER — ACETAMINOPHEN 10 MG/ML IV SOLN
INTRAVENOUS | Status: DC | PRN
  Administered 2014-11-13: 1000 mg via INTRAVENOUS

## 2014-11-13 MED ORDER — FENTANYL CITRATE (PF) 100 MCG/2ML IJ SOLN
25.0000 ug | INTRAMUSCULAR | Status: DC | PRN
  Administered 2014-11-13 (×4): 25 ug via INTRAVENOUS

## 2014-11-13 MED ORDER — BUPIVACAINE HCL 0.5 % IJ SOLN
INTRAMUSCULAR | Status: DC | PRN
Start: 2014-11-13 — End: 2014-11-13
  Administered 2014-11-13: 16 mL

## 2014-11-13 MED ORDER — DEXTROSE-NACL 5-0.45 % IV SOLN
INTRAVENOUS | Status: DC
  Administered 2014-11-13 – 2014-11-14 (×2): via INTRAVENOUS

## 2014-11-13 MED ORDER — DEXAMETHASONE SODIUM PHOSPHATE 4 MG/ML IJ SOLN
INTRAMUSCULAR | Status: DC | PRN
  Administered 2014-11-13: 5 mg via INTRAVENOUS

## 2014-11-13 MED ORDER — ONDANSETRON HCL 4 MG/2ML IJ SOLN: 4.0000 mg | Freq: Once | INTRAMUSCULAR | Status: DC | PRN

## 2014-11-13 MED ORDER — KETOROLAC TROMETHAMINE 30 MG/ML IJ SOLN
30.0000 mg | Freq: Four times a day (QID) | INTRAMUSCULAR | Status: DC
  Administered 2014-11-13 – 2014-11-14 (×4): 30 mg via INTRAVENOUS
  Filled 2014-11-13 (×4): qty 1

## 2014-11-13 MED ORDER — FENTANYL CITRATE (PF) 100 MCG/2ML IJ SOLN
INTRAMUSCULAR | Status: DC | PRN
  Administered 2014-11-13: 50 ug via INTRAVENOUS
  Administered 2014-11-13: 100 ug via INTRAVENOUS
  Administered 2014-11-13 (×2): 50 ug via INTRAVENOUS

## 2014-11-13 MED ORDER — FENTANYL CITRATE (PF) 100 MCG/2ML IJ SOLN
INTRAMUSCULAR | Status: AC
  Filled 2014-11-13: qty 2

## 2014-11-13 MED ORDER — ACETAMINOPHEN 10 MG/ML IV SOLN
INTRAVENOUS | Status: AC
  Filled 2014-11-13: qty 100

## 2014-11-13 MED ORDER — KETAMINE HCL 50 MG/ML IJ SOLN
INTRAMUSCULAR | Status: DC | PRN
  Administered 2014-11-13 (×2): 25 mg via INTRAVENOUS

## 2014-11-13 MED ORDER — INFLUENZA VAC SPLIT QUAD 0.5 ML IM SUSY: 0.5000 mL | PREFILLED_SYRINGE | INTRAMUSCULAR | Status: DC

## 2014-11-13 MED ORDER — OXYCODONE-ACETAMINOPHEN 5-325 MG PO TABS
1.0000 | ORAL_TABLET | ORAL | Status: DC | PRN
Start: 2014-11-13 — End: 2014-11-14
  Administered 2014-11-13 – 2014-11-14 (×4): 2 via ORAL
  Administered 2014-11-14: 1 via ORAL
  Filled 2014-11-13: qty 1
  Filled 2014-11-13 (×4): qty 2

## 2014-11-13 MED ORDER — SUGAMMADEX SODIUM 200 MG/2ML IV SOLN
INTRAVENOUS | Status: DC | PRN
Start: 2014-11-13 — End: 2014-11-13
  Administered 2014-11-13: 261.2 mg via INTRAVENOUS

## 2014-11-13 MED ORDER — FAMOTIDINE 20 MG PO TABS
ORAL_TABLET | ORAL | Status: AC
  Administered 2014-11-13: 20 mg via ORAL
  Filled 2014-11-13: qty 1

## 2014-11-13 MED ORDER — KETOROLAC TROMETHAMINE 30 MG/ML IJ SOLN: 30.0000 mg | Freq: Four times a day (QID) | INTRAMUSCULAR | Status: DC

## 2014-11-13 MED ORDER — HYDROMORPHONE HCL 1 MG/ML IJ SOLN: 0.2500 mg | INTRAMUSCULAR | Status: DC | PRN

## 2014-11-13 MED ORDER — LACTATED RINGERS IV SOLN
INTRAVENOUS | Status: DC
  Administered 2014-11-13 (×2): via INTRAVENOUS

## 2014-11-13 MED ORDER — BUPIVACAINE HCL (PF) 0.5 % IJ SOLN
INTRAMUSCULAR | Status: AC
  Filled 2014-11-13: qty 30

## 2014-11-13 MED ORDER — ROCURONIUM BROMIDE 100 MG/10ML IV SOLN
INTRAVENOUS | Status: DC | PRN
  Administered 2014-11-13: 20 mg via INTRAVENOUS
  Administered 2014-11-13: 30 mg via INTRAVENOUS
  Administered 2014-11-13: 10 mg via INTRAVENOUS

## 2014-11-13 MED ORDER — MORPHINE SULFATE (PF) 2 MG/ML IV SOLN
1.0000 mg | INTRAVENOUS | Status: DC | PRN
Start: 2014-11-13 — End: 2014-11-14
  Administered 2014-11-13 (×2): 2 mg via INTRAVENOUS
  Filled 2014-11-13 (×2): qty 1

## 2014-11-13 MED ORDER — CEFAZOLIN SODIUM-DEXTROSE 2-3 GM-% IV SOLR
2.0000 g | Freq: Once | INTRAVENOUS | Status: AC
  Administered 2014-11-13: 2 g via INTRAVENOUS

## 2014-11-13 MED ORDER — SUCCINYLCHOLINE CHLORIDE 20 MG/ML IJ SOLN
INTRAMUSCULAR | Status: DC | PRN
  Administered 2014-11-13: 100 mg via INTRAVENOUS

## 2014-11-13 MED ORDER — FAMOTIDINE 20 MG PO TABS
20.0000 mg | ORAL_TABLET | Freq: Once | ORAL | Status: AC
  Administered 2014-11-13: 20 mg via ORAL

## 2014-11-13 MED ORDER — MIDAZOLAM HCL 2 MG/2ML IJ SOLN
INTRAMUSCULAR | Status: DC | PRN
  Administered 2014-11-13: 2 mg via INTRAVENOUS

## 2014-11-13 MED ORDER — CEFAZOLIN SODIUM-DEXTROSE 2-3 GM-% IV SOLR
INTRAVENOUS | Status: AC
  Administered 2014-11-13: 2 g via INTRAVENOUS
  Filled 2014-11-13: qty 50

## 2014-11-13 MED ORDER — SIMETHICONE 80 MG PO CHEW: 80.0000 mg | CHEWABLE_TABLET | Freq: Four times a day (QID) | ORAL | Status: DC | PRN

## 2014-11-13 SURGICAL SUPPLY — 49 items
APL SRG 38 LTWT LNG FL B (MISCELLANEOUS) ×2
APPLICATOR ARISTA FLEXITIP XL (MISCELLANEOUS) ×1 IMPLANT
BAG URO DRAIN 2000ML W/SPOUT (MISCELLANEOUS) ×2 IMPLANT
BLADE SURG SZ11 CARB STEEL (BLADE) ×3 IMPLANT
CANISTER SUCT 1200ML W/VALVE (MISCELLANEOUS) ×3 IMPLANT
CATH FOLEY 2WAY  5CC 16FR (CATHETERS) ×1
CATH FOLEY 2WAY 5CC 16FR (CATHETERS) ×2
CATH ROBINSON RED A/P 16FR (CATHETERS) ×2 IMPLANT
CATH URTH 16FR FL 2W BLN LF (CATHETERS) ×2 IMPLANT
CHLORAPREP W/TINT 26ML (MISCELLANEOUS) ×3 IMPLANT
DRAPE UNDER BUTTOCK W/FLU (DRAPES) ×3 IMPLANT
FILTER LAP SMOKE EVAC STRL (MISCELLANEOUS) ×2 IMPLANT
GLOVE BIO SURGEON STRL SZ7 (GLOVE) ×14 IMPLANT
GLOVE INDICATOR 7.5 STRL GRN (GLOVE) ×8 IMPLANT
GOWN STRL REUS W/ TWL LRG LVL3 (GOWN DISPOSABLE) ×4 IMPLANT
GOWN STRL REUS W/ TWL XL LVL3 (GOWN DISPOSABLE) ×2 IMPLANT
GOWN STRL REUS W/TWL LRG LVL3 (GOWN DISPOSABLE) ×18
GOWN STRL REUS W/TWL XL LVL3 (GOWN DISPOSABLE) ×3
HEMOSTAT ARISTA ABSORB 1G (Miscellaneous) ×1 IMPLANT
IRRIGATION STRYKERFLOW (MISCELLANEOUS) ×2 IMPLANT
IRRIGATOR STRYKERFLOW (MISCELLANEOUS) ×3
IV LACTATED RINGERS 1000ML (IV SOLUTION) ×3 IMPLANT
IV NS 1000ML (IV SOLUTION) ×3
IV NS 1000ML BAXH (IV SOLUTION) ×2 IMPLANT
LABEL OR SOLS (LABEL) ×3 IMPLANT
LIQUID BAND (GAUZE/BANDAGES/DRESSINGS) ×3 IMPLANT
MANIPULATOR VCARE LG CRV RETR (MISCELLANEOUS) ×3 IMPLANT
MANIPULATOR VCARE STD CRV RETR (MISCELLANEOUS) ×2 IMPLANT
NS IRRIG 500ML POUR BTL (IV SOLUTION) ×3 IMPLANT
OCCLUDER COLPOPNEUMO (BALLOONS) ×3 IMPLANT
PACK GYN LAPAROSCOPIC (MISCELLANEOUS) ×3 IMPLANT
PAD OB MATERNITY 4.3X12.25 (PERSONAL CARE ITEMS) ×3 IMPLANT
PAD PREP 24X41 OB/GYN DISP (PERSONAL CARE ITEMS) ×3 IMPLANT
SET CYSTO W/LG BORE CLAMP LF (SET/KITS/TRAYS/PACK) ×3 IMPLANT
SHEARS HARMONIC ACE PLUS 36CM (ENDOMECHANICALS) ×3 IMPLANT
SLEEVE ENDOPATH XCEL 5M (ENDOMECHANICALS) ×6 IMPLANT
SPONGE LAP 18X18 5 PK (GAUZE/BANDAGES/DRESSINGS) ×2 IMPLANT
SPONGE XRAY 4X4 16PLY STRL (MISCELLANEOUS) ×3 IMPLANT
STRAP SAFETY BODY (MISCELLANEOUS) ×3 IMPLANT
SURGILUBE 2OZ TUBE FLIPTOP (MISCELLANEOUS) ×3 IMPLANT
SUT VIC AB 0 CT1 27 (SUTURE) ×3
SUT VIC AB 0 CT1 27XCR 8 STRN (SUTURE) ×2 IMPLANT
SUT VIC AB 2-0 UR6 27 (SUTURE) ×2 IMPLANT
SUT VIC AB 4-0 FS2 27 (SUTURE) ×5 IMPLANT
SYR 50ML LL SCALE MARK (SYRINGE) ×3 IMPLANT
SYRINGE 10CC LL (SYRINGE) ×3 IMPLANT
TROCAR XCEL NON-BLD 5MMX100MML (ENDOMECHANICALS) ×3 IMPLANT
TUBING INSUFFLATOR HEATED (MISCELLANEOUS) ×3 IMPLANT
WATER STERILE IRR 3000ML UROMA (IV SOLUTION) ×3 IMPLANT

## 2014-11-13 NOTE — Brief Op Note (Signed)
11/13/2014  12:55 PM  PATIENT:  Marie Morales  39 y.o. female  PRE-OPERATIVE DIAGNOSIS:  ENDOMETRIOSIS  POST-OPERATIVE DIAGNOSIS:  same  PROCEDURE:  Procedure(s): HYSTERECTOMY TOTAL LAPAROSCOPIC (N/A) CYSTOSCOPY (N/A)  SURGEON:  Surgeon(s) and Role:    * Malachy Mood, MD - Primary    * Will Bonnet, MD - Assisting  ANESTHESIA:   general  EBL:  Total I/O In: 1000 [I.V.:1000] Out: 300 [Urine:250; Blood:50]  BLOOD ADMINISTERED:none  DRAINS: none and Urinary Catheter (Foley)   LOCAL MEDICATIONS USED:  LIDOCAINE   SPECIMEN:  Source of Specimen:  uterus cervix, right and left tube (s/p prior tubal ligation)  DISPOSITION OF SPECIMEN:  PATHOLOGY  COUNTS:  YES  TOURNIQUET:  * No tourniquets in log *  DICTATION: .Note written in EPIC  PLAN OF CARE: Admit to inpatient   PATIENT DISPOSITION:  PACU - hemodynamically stable.   Delay start of Pharmacological VTE agent (>24hrs) due to surgical blood loss or risk of bleeding: yes

## 2014-11-13 NOTE — Op Note (Signed)
Preoperative Diagnosis: 1) 39 y.o. with endometriosis 2) Chronic pelvic pain  Postoperative Diagnosis: 1) 39 y.o. with endometriosis 2) Chronic pelvic pain  Operation Performed: Laparoscopic total hysterectomy, bilateral salpingectomy, and cystoscopy  Indication: 39 y.o. G2P2 history of prior ablation and tubal ligation, significant pelvic pain and laparoscopy proved endometriosis  Anesthesia: General  Preoperative Antibiotics: 2g ancef  Estimated Blood Loss: 47mL  IV Fluids: 1L crystaloid  Urine Output:: 170mL  Drains or Tubes: Foley to gravity drainage  Implants: none  Specimens Removed: uterus, cervix, and remaining portion of distal tubes  Complications: none  Intraoperative Findings: Normal ovaries and uterus.   Patient s/p prior partial salpingectomy.  Normal bladder with bilateral efflux of urine from both ureters.  Normal appendix.  Patient Condition: stable  Procedure in Detail:  Patient was taken to the operating room where she was administered general anesthesia.  She was positioned in the dorsal lithotomy position utilizing Allen stirups, prepped and draped in the usual sterile fashion.  Prior to proceeding with procedure a time out was performed.  Attention was turned to the patient's pelvis.  A indwelling foley catheter was used to empty the patient's bladder.  An operative speculum was placed to allow visualization of the cervix.  The anterior lip of the cervix was grasped with a single tooth tenaculum, and a large V-care was placed to allow manipulation of the uterus.  The operative speculum and single tooth tenaculum were then removed.  Attention was turned to the patient's abdomen.  The umbilicus was infiltrated with 1% Sensorcaine, before making a stab incision using an 11 blade scalpel.  A 61mm Excel trocar was then used to gain direct entry into the peritoneal cavity utilizing the camera to visualize progress of the trocar during placement.  Once peritoneal  entry had been achieved, insufflation was started and pneumoperitoneum established at a pressure of 48mmHg.   General inspection of the abdomen revealed the above noted findings, both ureters were visualized.  Two 58mm lateral lower quadrant (right and left) ports were placed under direct visualization.  The remaining distal portion of right tube was identified and grasped with an atraumatic grasper, excised using the 47mm harmonic.  The right round ligament was identified, transected to gain access to the broad ligament.  The anterior leaf of the broad was dissected down to the level of the internal cervical os and a bladder flap was started.  The utero ovarian ligament was cauterized and transected using the 68mm harmonic and the posterior leaf of the broad ligament was taken down to the level of the uterosacral ligament.  The uterine artery was skeletonized, cauterized and transected using the harmonic.    The right adnexal structures were dissected in a similar fashion and the bladder was further mobilized off the V-care cup.  An anterior colpotomy was made using the harmonic, carried around circumferentially freeing the specimen which was delivered vaginally.  The vaginal cuff was closed using a vaginal approach and interrupted figure of eight 0 Vicryl stitches.  Cystoscopy was performed after closure of the vaginal cuff noting bilateral efflux of urine from both ureters and an intact bladder dome.  The foley catheter was replaced after cystoscopy.  The vaginal cuff was inspected from the abdominal site, the pelvic was irrigated, all pedicles were noted to be hemostatic.  1 gram of Arista was applied to all pedicles.  Pneumoperitoneum was evacuated, the trocars were removed.All trocar sites were then dressed with surgical skin glue.  Sponge needle and instrument counts  were correct time two.  The patient tolerated the procedure well and was taken to the recovery room in stable condition.

## 2014-11-13 NOTE — Anesthesia Preprocedure Evaluation (Signed)
Anesthesia Evaluation  Patient identified by MRN, date of birth, ID band Patient awake    Reviewed: Allergy & Precautions, H&P , NPO status , Patient's Chart, lab work & pertinent test results, reviewed documented beta blocker date and time   History of Anesthesia Complications (+) PONV and history of anesthetic complications  Airway Mallampati: II  TM Distance: >3 FB Neck ROM: full    Dental  (+) Teeth Intact   Pulmonary neg pulmonary ROS, asthma ,    Pulmonary exam normal        Cardiovascular negative cardio ROS Normal cardiovascular exam Rhythm:regular Rate:Normal     Neuro/Psych  Headaches, negative neurological ROS  negative psych ROS   GI/Hepatic negative GI ROS, Neg liver ROS,   Endo/Other  negative endocrine ROSMorbid obesity  Renal/GU negative Renal ROS  negative genitourinary   Musculoskeletal   Abdominal   Peds  Hematology negative hematology ROS (+)   Anesthesia Other Findings   Reproductive/Obstetrics negative OB ROS                             Anesthesia Physical Anesthesia Plan  ASA: III  Anesthesia Plan: General ETT   Post-op Pain Management:    Induction:   Airway Management Planned:   Additional Equipment:   Intra-op Plan:   Post-operative Plan:   Informed Consent: I have reviewed the patients History and Physical, chart, labs and discussed the procedure including the risks, benefits and alternatives for the proposed anesthesia with the patient or authorized representative who has indicated his/her understanding and acceptance.     Plan Discussed with: CRNA  Anesthesia Plan Comments:         Anesthesia Quick Evaluation

## 2014-11-13 NOTE — H&P (Signed)
Date of Initial paper H&P:  11/11/2014  History reviewed, patient examined, no change in status, stable for surgery.

## 2014-11-13 NOTE — Anesthesia Procedure Notes (Signed)
Procedure Name: Intubation Performed by: Rolla Plate Pre-anesthesia Checklist: Patient identified, Patient being monitored, Timeout performed, Emergency Drugs available and Suction available Patient Re-evaluated:Patient Re-evaluated prior to inductionOxygen Delivery Method: Circle system utilized Preoxygenation: Pre-oxygenation with 100% oxygen Intubation Type: IV induction Ventilation: Mask ventilation without difficulty Laryngoscope Size: Miller and 2 Grade View: Grade II Tube type: Oral Tube size: 7.0 mm Number of attempts: 1 Airway Equipment and Method: Stylet and Patient positioned with wedge pillow Placement Confirmation: ETT inserted through vocal cords under direct vision,  positive ETCO2 and breath sounds checked- equal and bilateral Secured at: 20 cm Tube secured with: Tape Dental Injury: Teeth and Oropharynx as per pre-operative assessment

## 2014-11-13 NOTE — Transfer of Care (Signed)
Immediate Anesthesia Transfer of Care Note  Patient: Marie Morales  Procedure(s) Performed: Procedure(s): HYSTERECTOMY TOTAL LAPAROSCOPIC (N/A) CYSTOSCOPY (N/A)  Patient Location: PACU  Anesthesia Type:General  Level of Consciousness: awake, alert , oriented and patient cooperative  Airway & Oxygen Therapy: Patient Spontanous Breathing and Patient connected to face mask oxygen  Post-op Assessment: Report given to RN, Post -op Vital signs reviewed and stable and Patient moving all extremities X 4  Post vital signs: Reviewed and stable  Last Vitals:  Filed Vitals:   11/13/14 1312  BP: 145/95  Pulse: 81  Temp: 36.5 C  Resp: 16    Complications: No apparent anesthesia complications

## 2014-11-14 ENCOUNTER — Encounter: Payer: Self-pay | Admitting: Obstetrics and Gynecology

## 2014-11-14 DIAGNOSIS — N809 Endometriosis, unspecified: Secondary | ICD-10-CM | POA: Diagnosis not present

## 2014-11-14 LAB — CBC
HCT: 35.2 % (ref 35.0–47.0)
Hemoglobin: 11.7 g/dL — ABNORMAL LOW (ref 12.0–16.0)
MCH: 29.4 pg (ref 26.0–34.0)
MCHC: 33.4 g/dL (ref 32.0–36.0)
MCV: 88.1 fL (ref 80.0–100.0)
Platelets: 260 10*3/uL (ref 150–440)
RBC: 3.99 MIL/uL (ref 3.80–5.20)
RDW: 14.4 % (ref 11.5–14.5)
WBC: 9.8 10*3/uL (ref 3.6–11.0)

## 2014-11-14 LAB — BASIC METABOLIC PANEL
Anion gap: 4 — ABNORMAL LOW (ref 5–15)
BUN: 9 mg/dL (ref 6–20)
CO2: 22 mmol/L (ref 22–32)
Calcium: 8.4 mg/dL — ABNORMAL LOW (ref 8.9–10.3)
Chloride: 106 mmol/L (ref 101–111)
Creatinine, Ser: 0.77 mg/dL (ref 0.44–1.00)
GFR calc Af Amer: >60 mL/min (ref >60)
GFR calc non Af Amer: >60 mL/min (ref >60)
Glucose, Bld: 169 mg/dL — ABNORMAL HIGH (ref 65–99)
Potassium: 4 mmol/L (ref 3.5–5.1)
Sodium: 132 mmol/L — ABNORMAL LOW (ref 135–145)

## 2014-11-14 LAB — SURGICAL PATHOLOGY

## 2014-11-14 MED ORDER — OXYCODONE-ACETAMINOPHEN 5-325 MG PO TABS: 1.0000 | ORAL_TABLET | ORAL | Status: DC | PRN

## 2014-11-14 MED ORDER — IBUPROFEN 800 MG PO TABS: 800.0000 mg | ORAL_TABLET | Freq: Three times a day (TID) | ORAL | Status: DC | PRN

## 2014-11-14 NOTE — Anesthesia Postprocedure Evaluation (Signed)
  Anesthesia Post-op Note  Patient: Marie Morales  Procedure(s) Performed: Procedure(s): HYSTERECTOMY TOTAL LAPAROSCOPIC,bilateral salpingectomy (Bilateral) CYSTOSCOPY (N/A)  Anesthesia type:General ETT  Patient location: PACU  Post pain: Pain level controlled  Post assessment: Post-op Vital signs reviewed, Patient's Cardiovascular Status Stable, Respiratory Function Stable, Patent Airway and No signs of Nausea or vomiting  Post vital signs: Reviewed and stable  Last Vitals:  Filed Vitals:   11/14/14 1124  BP:   Pulse:   Temp: 36.9 C  Resp:     Level of consciousness: awake, alert  and patient cooperative  Complications: No apparent anesthesia complications

## 2014-11-14 NOTE — Progress Notes (Signed)
Discharge instructions reviewed with patient. Patient v/u of instructions. Prescription given to pt. Escorted by auxillary via w/c in stable condition.  Saintclair Halsted, RN 11/14/14 [phone]

## 2014-11-14 NOTE — Discharge Summary (Signed)
Physician Discharge Summary  Patient ID: Marie Morales MRN: [id number] DOB/AGE: [birth year] 39 y.o.  Admit date: 11/13/2014 Discharge date: 11/14/2014  Admission Diagnoses: Endometriosis, pelvic pain  Discharge Diagnoses: Endometriosis, pelvic pain Active Problems:   S/P laparoscopic hysterectomy   Discharged Condition: good  Hospital Course: Uncomplicated TLH, benign postpartum course with stable vital signs, labs.  Meeting all postoperative criteria including tolerating po, ambulating, reporting good pain control.  Consults: None  Significant Diagnostic Studies:  Results for orders placed or performed during the hospital encounter of 11/13/14 (from the past 24 hour(s))  Pregnancy, urine POC     Status: None   Collection Time: 11/13/14  9:57 AM  Result Value Ref Range   Preg Test, Ur NEGATIVE NEGATIVE  CBC     Status: Abnormal   Collection Time: 11/14/14  5:16 AM  Result Value Ref Range   WBC 9.8 3.6 - 11.0 K/uL   RBC 3.99 3.80 - 5.20 MIL/uL   Hemoglobin 11.7 (L) 12.0 - 16.0 g/dL   HCT 35.2 35.0 - 47.0 %   MCV 88.1 80.0 - 100.0 fL   MCH 29.4 26.0 - 34.0 pg   MCHC 33.4 32.0 - 36.0 g/dL   RDW 14.4 11.5 - 14.5 %   Platelets 260 150 - 440 K/uL  Basic metabolic panel     Status: Abnormal   Collection Time: 11/14/14  5:16 AM  Result Value Ref Range   Sodium 132 (L) 135 - 145 mmol/L   Potassium 4.0 3.5 - 5.1 mmol/L   Chloride 106 101 - 111 mmol/L   CO2 22 22 - 32 mmol/L   Glucose, Bld 169 (H) 65 - 99 mg/dL   BUN 9 6 - 20 mg/dL   Creatinine, Ser 0.77 0.44 - 1.00 mg/dL   Calcium 8.4 (L) 8.9 - 10.3 mg/dL   GFR calc non Af Amer >60 >60 mL/min   GFR calc Af Amer >60 >60 mL/min   Anion gap 4 (L) 5 - 15    Discharge Exam: Blood pressure 118/64, pulse 72, temperature 98.2 F (36.8 C), temperature source Oral, resp. rate 18, height 6\' 1"  (1.854 m), weight 130.636 kg (288 lb), SpO2 100 %. General appearance: alert, appears stated age and no distress Resp: clear to  auscultation bilaterally Cardio: regular rate and rhythm, S1, S2 normal, no murmur, click, rub or gallop GI: soft, non-tender; bowel sounds normal; no masses,  no organomegaly and trocar sites D/C/I Extremities: extremities normal, atraumatic, no cyanosis or edema  Disposition: 01-Home or Self Care  Discharge Instructions    Activity as tolerated    Complete by:  As directed      Call MD for:  difficulty breathing, headache or visual disturbances    Complete by:  As directed      Call MD for:  extreme fatigue    Complete by:  As directed      Call MD for:  hives    Complete by:  As directed      Call MD for:  persistant dizziness or light-headedness    Complete by:  As directed      Call MD for:  persistant nausea and vomiting    Complete by:  As directed      Call MD for:  redness, tenderness, or signs of infection (pain, swelling, redness, odor or green/yellow discharge around incision site)    Complete by:  As directed      Call MD for:  severe uncontrolled pain  Complete by:  As directed      Call MD for:  temperature >100.4    Complete by:  As directed      Diet - low sodium heart healthy    Complete by:  As directed      Discharge wound care:    Complete by:  As directed   Showers ok, no baths     Driving restriction     Complete by:  As directed   Avoid driving for at least 1 weeks or while taking prescription narcotics     Lifting restrictions    Complete by:  As directed   Weight restriction of 10 lbs.     No dressing needed    Complete by:  As directed      Sexual acrtivity    Complete by:  As directed   No intercourse for 6 weeks            Medication List    STOP taking these medications        doxycycline 50 MG capsule  Commonly known as:  VIBRAMYCIN     fluconazole 150 MG tablet  Commonly known as:  DIFLUCAN     polyethylene glycol powder powder  Commonly known as:  MIRALAX     silver sulfADIAZINE 1 % cream  Commonly known as:  SILVADENE      sulfamethoxazole-trimethoprim 800-160 MG tablet  Commonly known as:  BACTRIM DS,SEPTRA DS      TAKE these medications        furosemide 40 MG tablet  Commonly known as:  LASIX  Take 40 mg by mouth daily.     ibuprofen 800 MG tablet  Commonly known as:  ADVIL,MOTRIN  Take 1 tablet (800 mg total) by mouth every 8 (eight) hours as needed.     oxyCODONE-acetaminophen 5-325 MG tablet  Commonly known as:  PERCOCET/ROXICET  Take 1-2 tablets by mouth every 4 (four) hours as needed (moderate to severe pain (when tolerating fluids)).     potassium chloride 10 MEQ tablet  Commonly known as:  K-DUR,KLOR-CON  Take 10 mEq by mouth daily.           Follow-up Information    Follow up with Dorthula Nettles, MD In 1 week.   Specialty:  Obstetrics and Gynecology   Why:  For wound re-check   Contact information:   [address] [place] [address] [phone]       Signed: Dorthula Nettles 11/14/2014, 8:42 AM

## 2015-07-21 ENCOUNTER — Other Ambulatory Visit (HOSPITAL_COMMUNITY): Payer: Self-pay | Admitting: Physician Assistant

## 2015-07-21 DIAGNOSIS — Z1231 Encounter for screening mammogram for malignant neoplasm of breast: Secondary | ICD-10-CM

## 2015-07-24 ENCOUNTER — Ambulatory Visit (HOSPITAL_COMMUNITY)
Admission: RE | Admit: 2015-07-24 | Discharge: 2015-07-24 | Disposition: A | Discharge status: Home / Self Care | Payer: BC Managed Care – PPO | Source: Ambulatory Visit | Attending: Physician Assistant | Admitting: Physician Assistant

## 2015-07-24 DIAGNOSIS — R928 Other abnormal and inconclusive findings on diagnostic imaging of breast: Secondary | ICD-10-CM | POA: Diagnosis not present

## 2015-07-24 DIAGNOSIS — Z1231 Encounter for screening mammogram for malignant neoplasm of breast: Secondary | ICD-10-CM | POA: Diagnosis not present

## 2015-07-29 ENCOUNTER — Other Ambulatory Visit: Payer: Self-pay | Admitting: Physician Assistant

## 2015-07-29 DIAGNOSIS — R928 Other abnormal and inconclusive findings on diagnostic imaging of breast: Secondary | ICD-10-CM

## 2015-08-06 ENCOUNTER — Ambulatory Visit
Admission: RE | Admit: 2015-08-06 | Discharge: 2015-08-06 | Disposition: A | Discharge status: Home / Self Care | Payer: BC Managed Care – PPO | Source: Ambulatory Visit | Attending: Physician Assistant | Admitting: Physician Assistant

## 2015-08-06 DIAGNOSIS — R928 Other abnormal and inconclusive findings on diagnostic imaging of breast: Secondary | ICD-10-CM

## 2016-07-05 ENCOUNTER — Other Ambulatory Visit: Payer: Self-pay | Admitting: Physician Assistant

## 2016-07-05 DIAGNOSIS — Z1231 Encounter for screening mammogram for malignant neoplasm of breast: Secondary | ICD-10-CM

## 2016-07-25 ENCOUNTER — Ambulatory Visit: Payer: BC Managed Care – PPO

## 2016-08-03 ENCOUNTER — Ambulatory Visit
Admission: RE | Admit: 2016-08-03 | Discharge: 2016-08-03 | Disposition: A | Discharge status: Home / Self Care | Payer: BC Managed Care – PPO | Source: Ambulatory Visit | Attending: Physician Assistant | Admitting: Physician Assistant

## 2016-08-03 DIAGNOSIS — Z1231 Encounter for screening mammogram for malignant neoplasm of breast: Secondary | ICD-10-CM

## 2016-08-04 ENCOUNTER — Ambulatory Visit: Payer: BC Managed Care – PPO

## 2016-08-04 ENCOUNTER — Other Ambulatory Visit: Payer: Self-pay | Admitting: Physician Assistant

## 2016-08-04 DIAGNOSIS — R928 Other abnormal and inconclusive findings on diagnostic imaging of breast: Secondary | ICD-10-CM

## 2016-08-09 ENCOUNTER — Ambulatory Visit
Admission: RE | Admit: 2016-08-09 | Discharge: 2016-08-09 | Disposition: A | Discharge status: Home / Self Care | Payer: BC Managed Care – PPO | Source: Ambulatory Visit | Attending: Physician Assistant | Admitting: Physician Assistant

## 2016-08-09 ENCOUNTER — Other Ambulatory Visit: Payer: Self-pay | Admitting: Physician Assistant

## 2016-08-09 DIAGNOSIS — R928 Other abnormal and inconclusive findings on diagnostic imaging of breast: Secondary | ICD-10-CM

## 2017-02-10 ENCOUNTER — Other Ambulatory Visit: Payer: BC Managed Care – PPO

## 2017-02-27 ENCOUNTER — Other Ambulatory Visit: Payer: BC Managed Care – PPO

## 2017-03-13 ENCOUNTER — Ambulatory Visit: Admission: RE | Admit: 2017-03-13 | Payer: BC Managed Care – PPO | Source: Ambulatory Visit

## 2017-03-13 ENCOUNTER — Ambulatory Visit
Admission: RE | Admit: 2017-03-13 | Discharge: 2017-03-13 | Disposition: A | Discharge status: Home / Self Care | Payer: BC Managed Care – PPO | Source: Ambulatory Visit | Attending: Physician Assistant | Admitting: Physician Assistant

## 2017-03-13 DIAGNOSIS — R928 Other abnormal and inconclusive findings on diagnostic imaging of breast: Secondary | ICD-10-CM

## 2017-04-12 IMAGING — CT CT ABD-PELV W/ CM
2 of 4 series · 16 of 46 positions shown, 18 images · IV contrast (Omnipaque 300)
Comparison: None.

CLINICAL DATA: Mid abdominal pain for 1 month radiating to low
back. Headache. Nausea after eating.

EXAM:
CT ABDOMEN AND PELVIS WITH CONTRAST
TECHNIQUE: Multidetector CT imaging of the abdomen and pelvis was performed
using the standard protocol following bolus administration of
intravenous contrast.
CONTRAST:  50mL OMNIPAQUE IOHEXOL 300 MG/ML SOLN, 100mL OMNIPAQUE
IOHEXOL 300 MG/ML SOLN

[Series 2: abd_pel_with 5.0 b40f · axial · 0.77mm/px · z∈[-542,-106]mm · 13 of 95 slices shown, 15 images]
[im 4/95  soft-tissue]
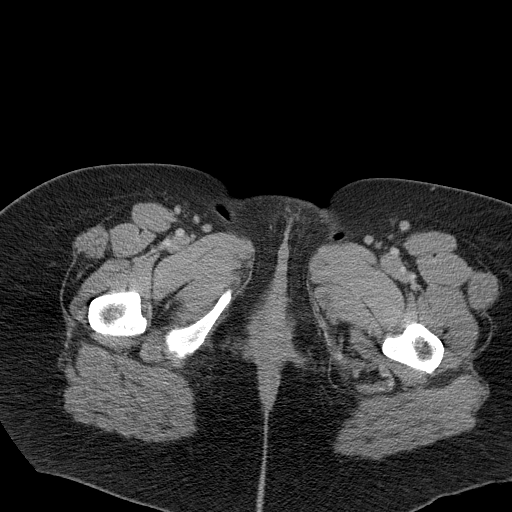
[im 4/95  bone]
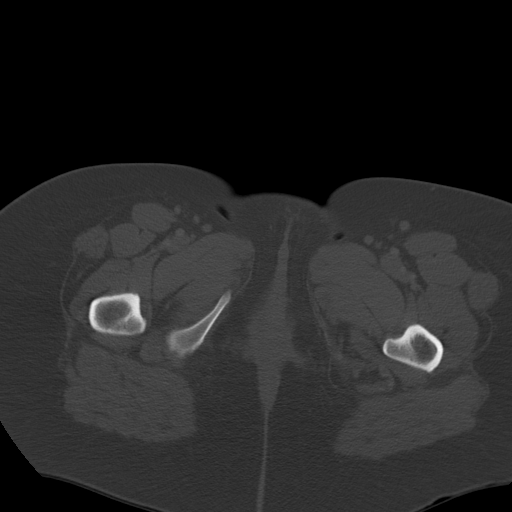
[im 12/95  soft-tissue]
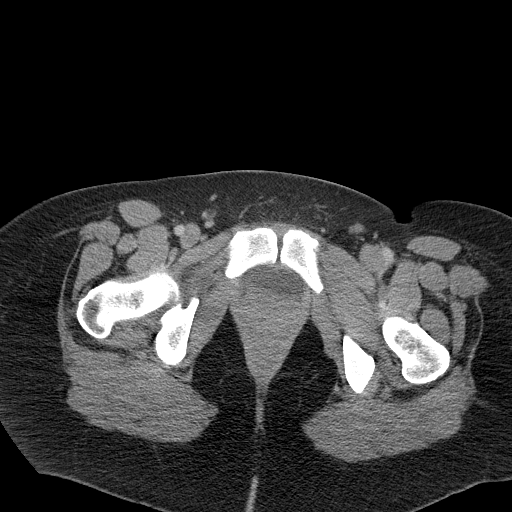
[im 20/95  soft-tissue]
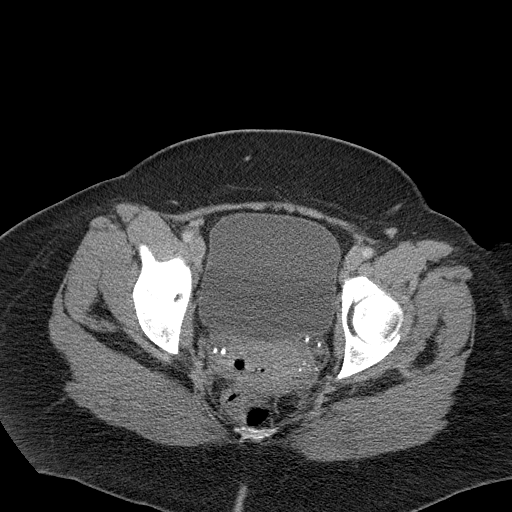
[im 28/95  soft-tissue]
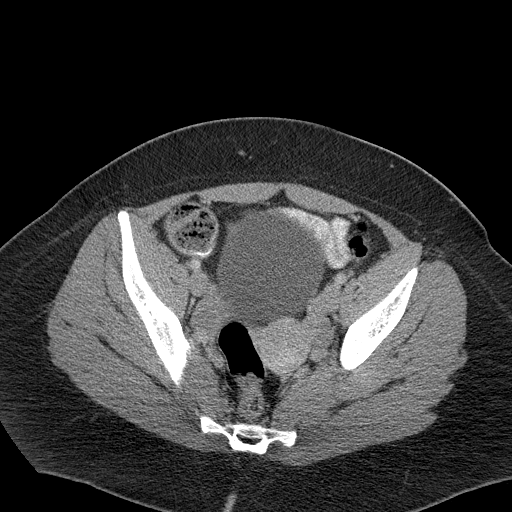
[im 32/95  soft-tissue]
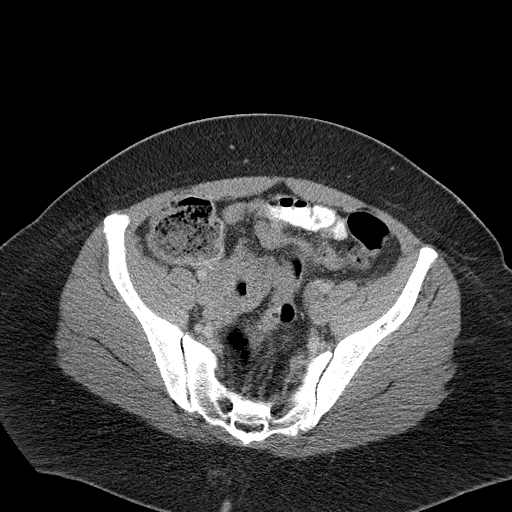
[im 40/95  soft-tissue]
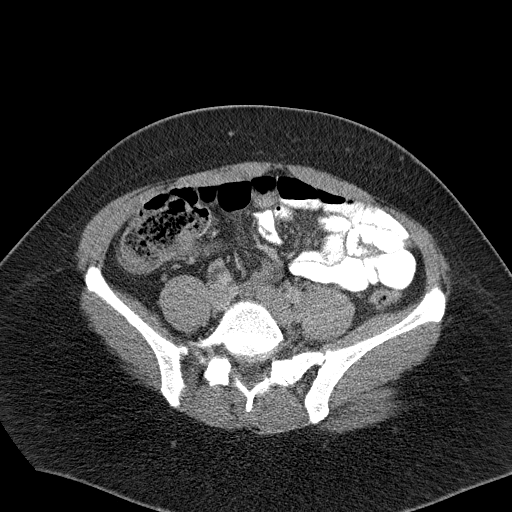
[im 48/95  soft-tissue]
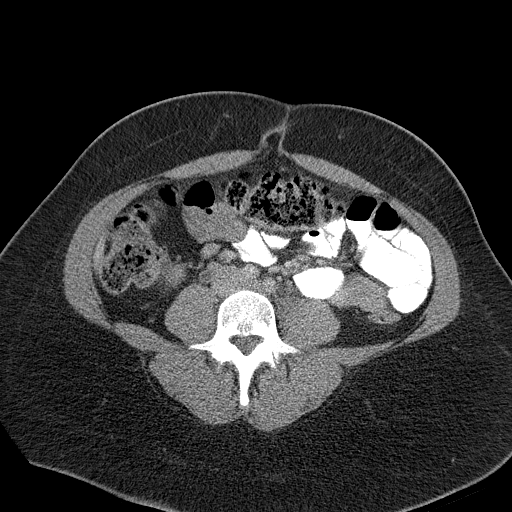
[im 55/95  soft-tissue]
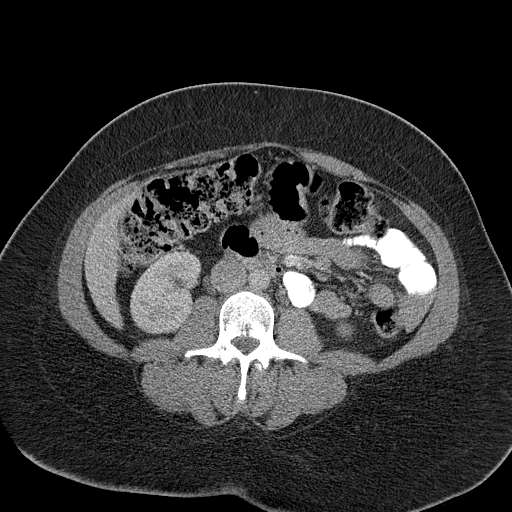
[im 63/95  soft-tissue]
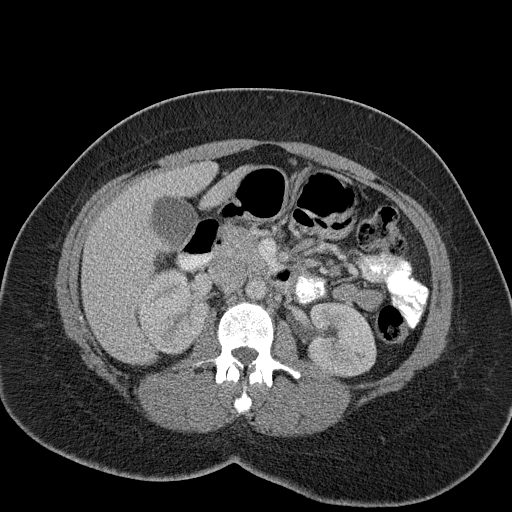
[im 63/95  bone]
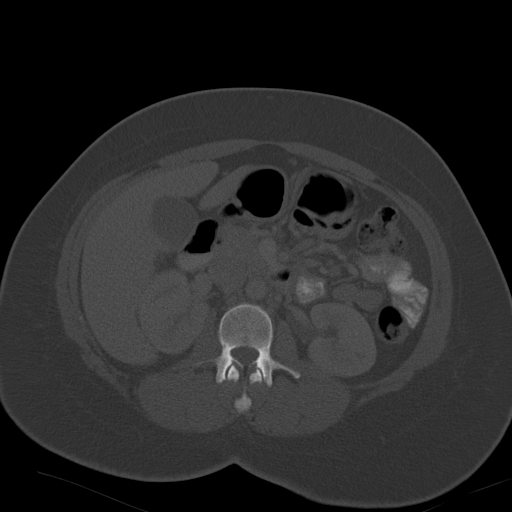
[im 67/95  soft-tissue]
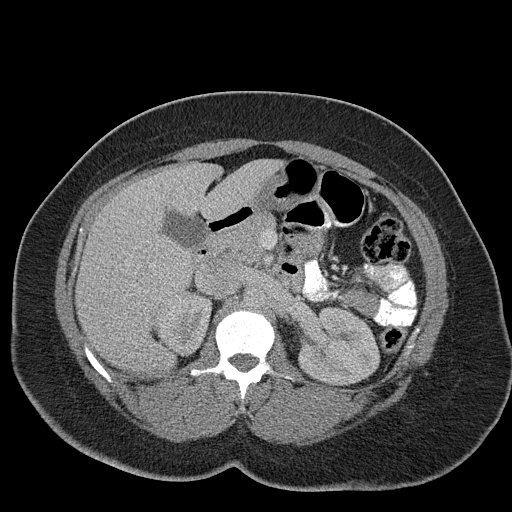
[im 75/95  soft-tissue]
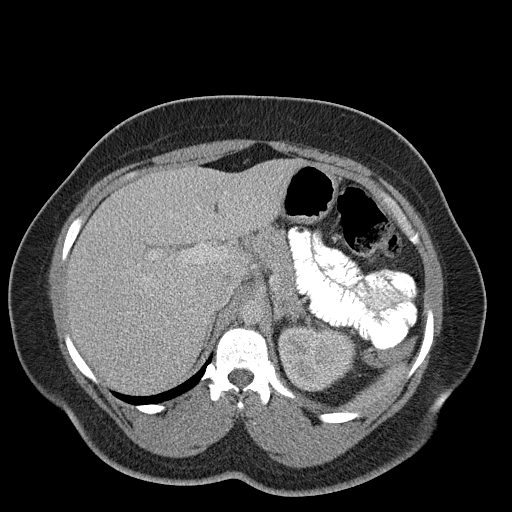
[im 83/95  soft-tissue]
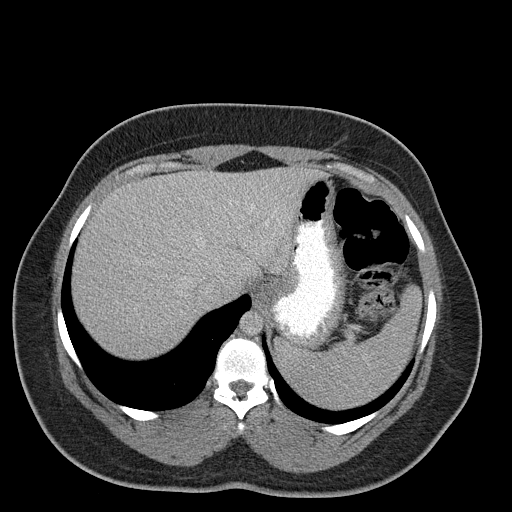
[im 91/95  soft-tissue]
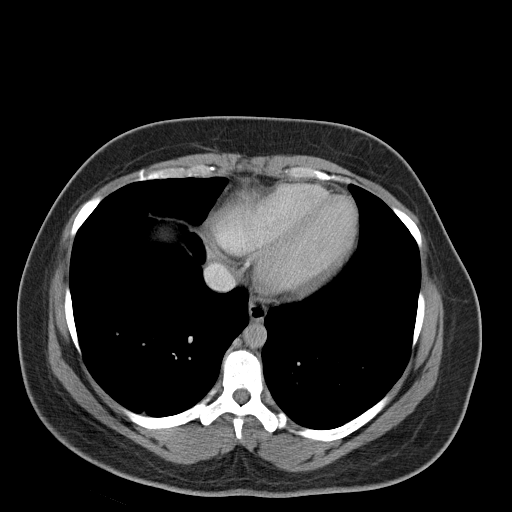

[Series 3: abd_pel_with 3.0 spo cor · coronal · 0.82mm/px · 3 of 102 slices shown]
[im 34/102  soft-tissue]
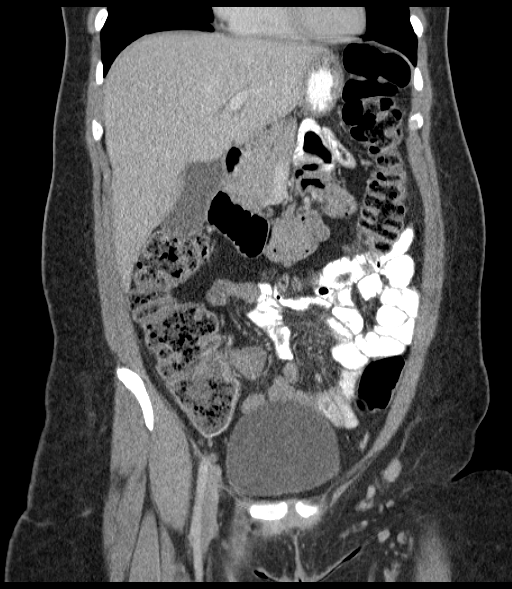
[im 45/102  soft-tissue]
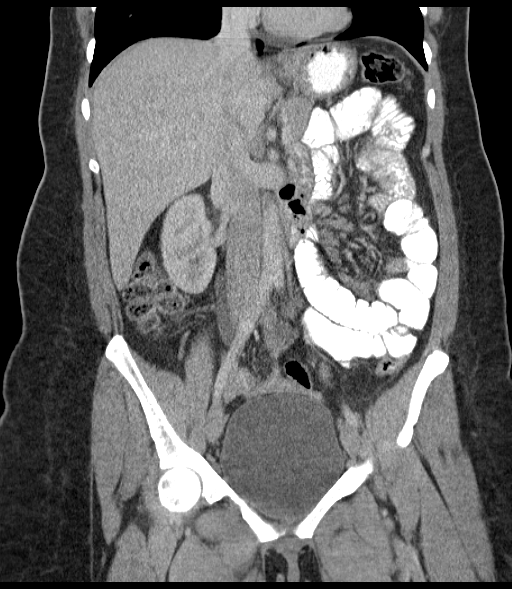
[im 57/102  soft-tissue]
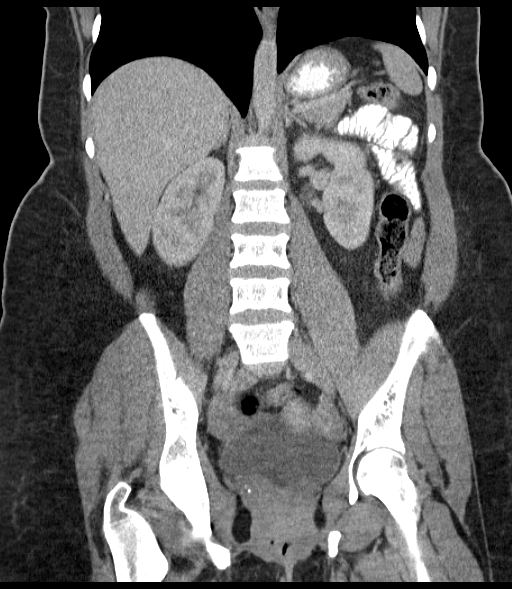

[16 of 46 positions shown; findings below may reference images not displayed]

FINDINGS: LUNG BASES: Included view of the lung bases are clear. Visualized
heart and pericardium are unremarkable.

SOLID ORGANS: The liver, spleen, gallbladder, pancreas and adrenal
glands are unremarkable.

GASTROINTESTINAL TRACT: The stomach, small and large bowel are
normal in course and caliber without inflammatory changes. Status
post appendectomy. Enteric contrast has not yet reached the distal
small bowel. Mild amount of retained large bowel stool.

KIDNEYS/ URINARY TRACT: Kidneys are orthotopic, demonstrating
symmetric enhancement. No nephrolithiasis, hydronephrosis or solid
renal masses. The unopacified ureters are normal in course and
caliber. Urinary bladder is partially distended and unremarkable.

PERITONEUM/RETROPERITONEUM: Aortoiliac vessels are normal in course
and caliber. No lymphadenopathy by CT size criteria. Small
mesenteric lymph nodes are likely reactive. Internal reproductive
organs are unremarkable. No intraperitoneal free fluid nor free air.
Phleboliths in the pelvis.

SOFT TISSUE/OSSEOUS STRUCTURES: Non-suspicious. Moderate lower
lumbar facet arthropathy. Small fat containing umbilical hernia.
IMPRESSION: Mild amount of retained large bowel stool without bowel obstruction
or acute intra-abdominal/ pelvic process.

By: Fior Jarreau

## 2018-02-12 ENCOUNTER — Other Ambulatory Visit: Payer: Self-pay | Admitting: Physician Assistant

## 2018-02-12 DIAGNOSIS — N6489 Other specified disorders of breast: Secondary | ICD-10-CM

## 2018-03-22 ENCOUNTER — Other Ambulatory Visit: Payer: Self-pay | Admitting: Nurse Practitioner

## 2018-03-22 DIAGNOSIS — N63 Unspecified lump in unspecified breast: Secondary | ICD-10-CM

## 2018-04-06 ENCOUNTER — Other Ambulatory Visit: Payer: BC Managed Care – PPO

## 2018-04-13 ENCOUNTER — Ambulatory Visit
Admission: RE | Admit: 2018-04-13 | Discharge: 2018-04-13 | Disposition: A | Discharge status: Home / Self Care | Payer: BC Managed Care – PPO | Source: Ambulatory Visit | Attending: Nurse Practitioner | Admitting: Nurse Practitioner

## 2018-04-13 DIAGNOSIS — N63 Unspecified lump in unspecified breast: Secondary | ICD-10-CM

## 2018-09-03 ENCOUNTER — Other Ambulatory Visit: Payer: Self-pay

## 2018-09-03 ENCOUNTER — Encounter: Payer: Self-pay | Admitting: *Deleted

## 2018-09-03 ENCOUNTER — Encounter
Admission: RE | Admit: 2018-09-03 | Discharge: 2018-09-03 | Disposition: A | Discharge status: Home / Self Care | Payer: Worker's Compensation | Source: Ambulatory Visit | Attending: Orthopedic Surgery | Admitting: Orthopedic Surgery

## 2018-09-03 DIAGNOSIS — M654 Radial styloid tenosynovitis [de Quervain]: Secondary | ICD-10-CM | POA: Diagnosis present

## 2018-09-03 DIAGNOSIS — Z79899 Other long term (current) drug therapy: Secondary | ICD-10-CM | POA: Diagnosis not present

## 2018-09-03 DIAGNOSIS — E119 Type 2 diabetes mellitus without complications: Secondary | ICD-10-CM | POA: Diagnosis not present

## 2018-09-03 DIAGNOSIS — Z20828 Contact with and (suspected) exposure to other viral communicable diseases: Secondary | ICD-10-CM | POA: Diagnosis not present

## 2018-09-03 DIAGNOSIS — Z6841 Body Mass Index (BMI) 40.0 and over, adult: Secondary | ICD-10-CM | POA: Diagnosis not present

## 2018-09-03 DIAGNOSIS — Z7984 Long term (current) use of oral hypoglycemic drugs: Secondary | ICD-10-CM | POA: Diagnosis not present

## 2018-09-03 NOTE — Patient Instructions (Signed)
Your procedure is scheduled on: 09/06/18 Report to Day Surgery. [facility]OND FLOOR To find out your arrival time please call [phone] between 1PM - 3PM on 09/05/18.  Remember: Instructions that are not followed completely may result in serious medical risk,  up to and including death, or upon the discretion of your surgeon and anesthesiologist your  surgery may need to be rescheduled.     _X__ 1. Do not eat food after midnight the night before your procedure.                 No gum chewing or hard candies. You may drink clear liquids up to 2 hours                 before you are scheduled to arrive for your surgery- DO not drink clear                 liquids within 2 hours of the start of your surgery.                 Clear Liquids include:  water, apple juice without pulp, clear carbohydrate                 drink such as Clearfast of Gatorade, Black Coffee or Tea (Do not add                 anything to coffee or tea).  __X__2.  On the morning of surgery brush your teeth with toothpaste and water, you                may rinse your mouth with mouthwash if you wish.  Do not swallow any toothpaste of mouthwash.     _X__ 3.  No Alcohol for 24 hours before or after surgery.   _X__ 4.  Do Not Smoke or use e-cigarettes For 24 Hours Prior to Your Surgery.                 Do not use any chewable tobacco products for at least 6 hours prior to                 surgery.  ____  5.  Bring all medications with you on the day of surgery if instructed.   __X__  6.  Notify your doctor if there is any change in your medical condition      (cold, fever, infections).     Do not wear jewelry, make-up, hairpins, clips or nail polish. Do not wear lotions, powders, or perfumes. You may wear deodorant. Do not shave 48 hours prior to surgery. Men may shave face and neck. Do not bring valuables to the hospital.    [facility] is not responsible for any belongings or  valuables.  Contacts, dentures or bridgework may not be worn into surgery. Leave your suitcase in the car. After surgery it may be brought to your room. For patients admitted to the hospital, discharge time is determined by your treatment team.   Patients discharged the day of surgery will not be allowed to drive home.   Please read over the following fact sheets that you were given:   Surgical Site Infection Prevention          ____ Take these medicines the morning of surgery with A SIP OF WATER:    1. NONE  2.   3.   4.  5.  6.  ____ Fleet Enema (as directed)  _X___ Use CHG Soap as directed  ____ Use inhalers on the day of surgery  ____ Stop metformin 2 days prior to surgery    ____ Take 1/2 of usual insulin dose the night before surgery. No insulin the morning          of surgery.   ____ Stop Coumadin/Plavix/aspirin on   __X__ Stop Anti-inflammatories on   09/03/18     CAN TAKE TYLENOL IF NEEDED   ____ Stop supplements until after surgery.    ____ Bring C-Pap to the hospital.

## 2018-09-04 ENCOUNTER — Encounter
Admission: RE | Admit: 2018-09-04 | Discharge: 2018-09-04 | Disposition: A | Discharge status: Home / Self Care | Payer: Worker's Compensation | Source: Ambulatory Visit | Attending: Orthopedic Surgery | Admitting: Orthopedic Surgery

## 2018-09-04 DIAGNOSIS — M654 Radial styloid tenosynovitis [de Quervain]: Secondary | ICD-10-CM | POA: Diagnosis not present

## 2018-09-04 NOTE — Pre-Procedure Instructions (Signed)
SPOKE Tijeras REQUESTED EKG BE FAXED TO [phone]

## 2018-09-05 LAB — SARS CORONAVIRUS 2 (TAT 6-24 HRS): SARS Coronavirus 2: NEGATIVE

## 2018-09-05 NOTE — Pre-Procedure Instructions (Signed)
Unable to get ekg. No cardiac/htn issues. Some minimal ankle swelling

## 2018-09-06 ENCOUNTER — Other Ambulatory Visit: Payer: Self-pay

## 2018-09-06 ENCOUNTER — Ambulatory Visit: Payer: Worker's Compensation | Admitting: Certified Registered"

## 2018-09-06 ENCOUNTER — Encounter: Payer: Self-pay | Admitting: *Deleted

## 2018-09-06 ENCOUNTER — Encounter
Admission: RE | Disposition: A | Discharge status: Home / Self Care | Payer: Self-pay | Source: Home / Self Care | Attending: Orthopedic Surgery

## 2018-09-06 ENCOUNTER — Ambulatory Visit
Admission: RE | Admit: 2018-09-06 | Discharge: 2018-09-06 | Disposition: A | Discharge status: Home / Self Care | Payer: Worker's Compensation | Attending: Orthopedic Surgery | Admitting: Orthopedic Surgery

## 2018-09-06 DIAGNOSIS — Z7984 Long term (current) use of oral hypoglycemic drugs: Secondary | ICD-10-CM | POA: Insufficient documentation

## 2018-09-06 DIAGNOSIS — Z79899 Other long term (current) drug therapy: Secondary | ICD-10-CM | POA: Insufficient documentation

## 2018-09-06 DIAGNOSIS — Z20828 Contact with and (suspected) exposure to other viral communicable diseases: Secondary | ICD-10-CM | POA: Insufficient documentation

## 2018-09-06 DIAGNOSIS — M654 Radial styloid tenosynovitis [de Quervain]: Secondary | ICD-10-CM | POA: Insufficient documentation

## 2018-09-06 DIAGNOSIS — Z6841 Body Mass Index (BMI) 40.0 and over, adult: Secondary | ICD-10-CM | POA: Insufficient documentation

## 2018-09-06 DIAGNOSIS — E119 Type 2 diabetes mellitus without complications: Secondary | ICD-10-CM | POA: Insufficient documentation

## 2018-09-06 HISTORY — DX: Edema, unspecified: R60.9

## 2018-09-06 HISTORY — DX: Headache, unspecified: R51.9

## 2018-09-06 HISTORY — PX: DORSAL COMPARTMENT RELEASE: SHX5039

## 2018-09-06 SURGERY — RELEASE, FIRST DORSAL COMPARTMENT, HAND
Anesthesia: General | Laterality: Right

## 2018-09-06 MED ORDER — ONDANSETRON HCL 4 MG PO TABS
ORAL_TABLET | ORAL | Status: AC
  Filled 2018-09-06: qty 1

## 2018-09-06 MED ORDER — FENTANYL CITRATE (PF) 100 MCG/2ML IJ SOLN
25.0000 ug | INTRAMUSCULAR | Status: DC | PRN
  Administered 2018-09-06 (×2): 25 ug via INTRAVENOUS

## 2018-09-06 MED ORDER — DEXAMETHASONE SODIUM PHOSPHATE 10 MG/ML IJ SOLN
INTRAMUSCULAR | Status: DC | PRN
  Administered 2018-09-06: 10 mg via INTRAVENOUS

## 2018-09-06 MED ORDER — HYDROCODONE-ACETAMINOPHEN 7.5-325 MG PO TABS
1.0000 | ORAL_TABLET | ORAL | Status: DC | PRN
  Filled 2018-09-06: qty 2

## 2018-09-06 MED ORDER — ACETAMINOPHEN 10 MG/ML IV SOLN
INTRAVENOUS | Status: AC
  Filled 2018-09-06: qty 100

## 2018-09-06 MED ORDER — MORPHINE SULFATE (PF) 4 MG/ML IV SOLN: 0.5000 mg | INTRAVENOUS | Status: DC | PRN

## 2018-09-06 MED ORDER — FENTANYL CITRATE (PF) 100 MCG/2ML IJ SOLN
INTRAMUSCULAR | Status: AC
  Filled 2018-09-06: qty 2

## 2018-09-06 MED ORDER — NEOMYCIN-POLYMYXIN B GU 40-200000 IR SOLN
Status: AC
  Filled 2018-09-06: qty 2

## 2018-09-06 MED ORDER — GLYCOPYRROLATE 0.2 MG/ML IJ SOLN
INTRAMUSCULAR | Status: DC | PRN
  Administered 2018-09-06: 0.2 mg via INTRAVENOUS

## 2018-09-06 MED ORDER — FENTANYL CITRATE (PF) 100 MCG/2ML IJ SOLN
INTRAMUSCULAR | Status: DC | PRN
  Administered 2018-09-06: 100 ug via INTRAVENOUS

## 2018-09-06 MED ORDER — LIDOCAINE HCL (CARDIAC) PF 100 MG/5ML IV SOSY
PREFILLED_SYRINGE | INTRAVENOUS | Status: DC | PRN
  Administered 2018-09-06: 100 mg via INTRAVENOUS

## 2018-09-06 MED ORDER — MIDAZOLAM HCL 2 MG/2ML IJ SOLN
INTRAMUSCULAR | Status: DC | PRN
  Administered 2018-09-06: 2 mg via INTRAVENOUS

## 2018-09-06 MED ORDER — FAMOTIDINE 20 MG PO TABS
20.0000 mg | ORAL_TABLET | Freq: Once | ORAL | Status: AC
  Administered 2018-09-06: 20 mg via ORAL

## 2018-09-06 MED ORDER — ESMOLOL HCL 100 MG/10ML IV SOLN
INTRAVENOUS | Status: DC | PRN
  Administered 2018-09-06: 20 mg via INTRAVENOUS

## 2018-09-06 MED ORDER — HYDROCODONE-ACETAMINOPHEN 5-325 MG PO TABS
1.0000 | ORAL_TABLET | ORAL | Status: DC | PRN
  Administered 2018-09-06: 1 via ORAL

## 2018-09-06 MED ORDER — MIDAZOLAM HCL 2 MG/2ML IJ SOLN
INTRAMUSCULAR | Status: AC
  Filled 2018-09-06: qty 2

## 2018-09-06 MED ORDER — SUCCINYLCHOLINE CHLORIDE 20 MG/ML IJ SOLN
INTRAMUSCULAR | Status: DC | PRN
  Administered 2018-09-06: 160 mg via INTRAVENOUS

## 2018-09-06 MED ORDER — BUPIVACAINE HCL (PF) 0.5 % IJ SOLN
INTRAMUSCULAR | Status: AC
  Filled 2018-09-06: qty 30

## 2018-09-06 MED ORDER — ACETAMINOPHEN 10 MG/ML IV SOLN
INTRAVENOUS | Status: DC | PRN
  Administered 2018-09-06: 1000 mg via INTRAVENOUS

## 2018-09-06 MED ORDER — ONDANSETRON HCL 4 MG/2ML IJ SOLN
INTRAMUSCULAR | Status: DC | PRN
  Administered 2018-09-06: 4 mg via INTRAVENOUS

## 2018-09-06 MED ORDER — FAMOTIDINE 20 MG PO TABS
ORAL_TABLET | ORAL | Status: AC
  Administered 2018-09-06: 10:00:00 20 mg via ORAL
  Filled 2018-09-06: qty 1

## 2018-09-06 MED ORDER — ONDANSETRON HCL 4 MG/2ML IJ SOLN: 4.0000 mg | Freq: Four times a day (QID) | INTRAMUSCULAR | Status: DC | PRN

## 2018-09-06 MED ORDER — METOCLOPRAMIDE HCL 10 MG PO TABS: 5.0000 mg | ORAL_TABLET | Freq: Three times a day (TID) | ORAL | Status: DC | PRN

## 2018-09-06 MED ORDER — PROPOFOL 10 MG/ML IV BOLUS
INTRAVENOUS | Status: DC | PRN
  Administered 2018-09-06: 200 mg via INTRAVENOUS

## 2018-09-06 MED ORDER — HYDROCODONE-ACETAMINOPHEN 5-325 MG PO TABS
ORAL_TABLET | ORAL | Status: AC
  Filled 2018-09-06: qty 1

## 2018-09-06 MED ORDER — ONDANSETRON HCL 4 MG PO TABS
4.0000 mg | ORAL_TABLET | Freq: Four times a day (QID) | ORAL | Status: DC | PRN
  Administered 2018-09-06: 4 mg via ORAL

## 2018-09-06 MED ORDER — TRAMADOL HCL 50 MG PO TABS: 100.0000 mg | ORAL_TABLET | Freq: Four times a day (QID) | ORAL | 0 refills | Status: DC | PRN

## 2018-09-06 MED ORDER — BUPIVACAINE HCL (PF) 0.5 % IJ SOLN
INTRAMUSCULAR | Status: DC | PRN
  Administered 2018-09-06: 10 mL

## 2018-09-06 MED ORDER — ONDANSETRON HCL 4 MG/2ML IJ SOLN: 4.0000 mg | Freq: Once | INTRAMUSCULAR | Status: DC | PRN

## 2018-09-06 MED ORDER — TRAMADOL HCL 50 MG PO TABS
50.0000 mg | ORAL_TABLET | Freq: Four times a day (QID) | ORAL | Status: DC
  Filled 2018-09-06: qty 1

## 2018-09-06 MED ORDER — ROCURONIUM BROMIDE 100 MG/10ML IV SOLN
INTRAVENOUS | Status: DC | PRN
  Administered 2018-09-06: 20 mg via INTRAVENOUS
  Administered 2018-09-06: 10 mg via INTRAVENOUS

## 2018-09-06 MED ORDER — METOCLOPRAMIDE HCL 5 MG/ML IJ SOLN: 5.0000 mg | Freq: Three times a day (TID) | INTRAMUSCULAR | Status: DC | PRN

## 2018-09-06 MED ORDER — LACTATED RINGERS IV SOLN
INTRAVENOUS | Status: DC
  Administered 2018-09-06: 10:00:00 via INTRAVENOUS

## 2018-09-06 MED ORDER — SODIUM CHLORIDE 0.9 % IV SOLN: INTRAVENOUS | Status: DC

## 2018-09-06 MED ORDER — ACETAMINOPHEN 325 MG PO TABS: 325.0000 mg | ORAL_TABLET | Freq: Four times a day (QID) | ORAL | Status: DC | PRN

## 2018-09-06 MED ORDER — FENTANYL CITRATE (PF) 100 MCG/2ML IJ SOLN
INTRAMUSCULAR | Status: AC
  Administered 2018-09-06: 25 ug via INTRAVENOUS
  Filled 2018-09-06: qty 2

## 2018-09-06 SURGICAL SUPPLY — 30 items
APL PRP STRL LF DISP 70% ISPRP (MISCELLANEOUS) ×1
BNDG CMPR STD VLCR NS LF 5.8X3 (GAUZE/BANDAGES/DRESSINGS) ×1
BNDG ELASTIC 3X5.8 VLCR NS LF (GAUZE/BANDAGES/DRESSINGS) ×2 IMPLANT
CANISTER SUCT 1200ML W/VALVE (MISCELLANEOUS) ×2 IMPLANT
CAST PADDING 3X4FT ST 30246 (SOFTGOODS) ×1
CHLORAPREP W/TINT 26 (MISCELLANEOUS) ×2 IMPLANT
COVER WAND RF STERILE (DRAPES) ×2 IMPLANT
CUFF TOURN SGL QUICK 18X4 (TOURNIQUET CUFF) IMPLANT
ELECT REM PT RETURN 9FT ADLT (ELECTROSURGICAL) ×2
ELECTRODE REM PT RTRN 9FT ADLT (ELECTROSURGICAL) ×1 IMPLANT
GAUZE SPONGE 4X4 12PLY STRL (GAUZE/BANDAGES/DRESSINGS) ×2 IMPLANT
GAUZE XEROFORM 1X8 LF (GAUZE/BANDAGES/DRESSINGS) ×2 IMPLANT
GLOVE SURG SYN 9.0  PF PI (GLOVE) ×1
GLOVE SURG SYN 9.0 PF PI (GLOVE) ×1 IMPLANT
GOWN SRG 2XL LVL 4 RGLN SLV (GOWNS) ×1 IMPLANT
GOWN STRL NON-REIN 2XL LVL4 (GOWNS) ×2
GOWN STRL REUS W/ TWL LRG LVL3 (GOWN DISPOSABLE) ×1 IMPLANT
GOWN STRL REUS W/TWL LRG LVL3 (GOWN DISPOSABLE) ×2
KIT TURNOVER KIT A (KITS) ×2 IMPLANT
NS IRRIG 500ML POUR BTL (IV SOLUTION) ×2 IMPLANT
PACK EXTREMITY ARMC (MISCELLANEOUS) ×2 IMPLANT
PAD CAST CTTN 3X4 STRL (SOFTGOODS) ×1 IMPLANT
PADDING CAST COTTON 3X4 STRL (SOFTGOODS) ×1
SCALPEL PROTECTED #15 DISP (BLADE) ×4 IMPLANT
SPLINT CAST 1 STEP 3X12 (MISCELLANEOUS) ×2 IMPLANT
SUT ETHILON 4-0 (SUTURE) ×2
SUT ETHILON 4-0 FS2 18XMFL BLK (SUTURE) ×1
SUT VIC AB 3-0 SH 27 (SUTURE) ×2
SUT VIC AB 3-0 SH 27X BRD (SUTURE) ×1 IMPLANT
SUTURE ETHLN 4-0 FS2 18XMF BLK (SUTURE) ×1 IMPLANT

## 2018-09-06 NOTE — Op Note (Signed)
09/06/2018  11:41 AM  PATIENT:  Marie Morales  43 y.o. female  PRE-OPERATIVE DIAGNOSIS:  DE QUERVAINS DISEASE  POST-OPERATIVE DIAGNOSIS:  right DeQuervain's   PROCEDURE:  Procedure(s): MINOR RELEASE DORSAL COMPARTMENT (DEQUERVAINS) (Right)  SURGEON: Laurene Footman, MD  ASSISTANTS: None  ANESTHESIA:   general  EBL:  No intake/output data recorded.  BLOOD ADMINISTERED:none  DRAINS: none   LOCAL MEDICATIONS USED:  MARCAINE     SPECIMEN:  No Specimen  DISPOSITION OF SPECIMEN:  N/A  COUNTS:  YES  TOURNIQUET:   Total Tourniquet Time Documented: Upper Arm (Right) - 9 minutes Total: Upper Arm (Right) - 9 minutes   IMPLANTS: None  DICTATION: .Dragon Dictation patient was brought to the operating room and after adequate anesthesia was obtained the right arm was prepped and draped in the usual sterile fashion.  After patient identification and timeout procedures were completed, tourniquet was raised to 250 mmHg.  A transverse incision was made over the radial styloid approximately 1.5 cm  in length and the soft tissue spread preserving branches of the superficial radial nerve.  The area of compression was identified and opened with a scalpel and then released proximally and distally with scissors.  There was moderate tenosynovitis present around the tendons but they were otherwise intact.  After complete release passive range of motion showed no further compression.  The wound was then irrigated and then closed with 4-0 Monocryl followed by Dermabond after that it set Telfa 4 x 4's web roll and Ace wrap applied and the tourniquet let down  PLAN OF CARE: Discharge to home after PACU  PATIENT DISPOSITION:  PACU - hemodynamically stable.

## 2018-09-06 NOTE — Anesthesia Post-op Follow-up Note (Signed)
Anesthesia QCDR form completed.        

## 2018-09-06 NOTE — Transfer of Care (Signed)
Immediate Anesthesia Transfer of Care Note  Patient: Marie Morales  Procedure(s) Performed: MINOR RELEASE DORSAL COMPARTMENT (DEQUERVAINS) (Right )  Patient Location: PACU  Anesthesia Type:General  Level of Consciousness: awake, drowsy and patient cooperative  Airway & Oxygen Therapy: Patient Spontanous Breathing and Patient connected to face mask oxygen  Post-op Assessment: Report given to RN and Post -op Vital signs reviewed and stable  Post vital signs: Reviewed and stable  Last Vitals:  Vitals Value Taken Time  BP 139/86 09/06/18 1145  Temp 36.1 C 09/06/18 1145  Pulse 76 09/06/18 1145  Resp 20 09/06/18 1145  SpO2 99 % 09/06/18 1145  Vitals shown include unvalidated device data.  Last Pain:  Vitals:   09/06/18 0953  TempSrc: Oral  PainSc: 8          Complications: No apparent anesthesia complications

## 2018-09-06 NOTE — Anesthesia Preprocedure Evaluation (Signed)
Anesthesia Evaluation  Patient identified by MRN, date of birth, ID band Patient awake    Reviewed: Allergy & Precautions, H&P , NPO status , Patient's Chart, lab work & pertinent test results, reviewed documented beta blocker date and time   History of Anesthesia Complications (+) PONV and history of anesthetic complications  Airway Mallampati: II  TM Distance: >3 FB Neck ROM: full    Dental  (+) Teeth Intact   Pulmonary neg pulmonary ROS, asthma ,    Pulmonary exam normal        Cardiovascular negative cardio ROS Normal cardiovascular exam Rhythm:regular Rate:Normal     Neuro/Psych  Headaches, negative neurological ROS  negative psych ROS   GI/Hepatic negative GI ROS, Neg liver ROS,   Endo/Other  negative endocrine ROSMorbid obesity  Renal/GU negative Renal ROS  negative genitourinary   Musculoskeletal  (+) Arthritis , Osteoarthritis,    Abdominal Normal abdominal exam  (+)   Peds negative pediatric ROS (+)  Hematology negative hematology ROS (+)   Anesthesia Other Findings   Reproductive/Obstetrics negative OB ROS                             Anesthesia Physical  Anesthesia Plan  ASA: III  Anesthesia Plan: General   Post-op Pain Management:    Induction: Intravenous  PONV Risk Score and Plan:   Airway Management Planned: Oral ETT  Additional Equipment:   Intra-op Plan:   Post-operative Plan: Extubation in OR  Informed Consent: I have reviewed the patients History and Physical, chart, labs and discussed the procedure including the risks, benefits and alternatives for the proposed anesthesia with the patient or authorized representative who has indicated his/her understanding and acceptance.       Plan Discussed with: CRNA and Surgeon  Anesthesia Plan Comments:         Anesthesia Quick Evaluation

## 2018-09-06 NOTE — H&P (Signed)
Reviewed paper H+P, will be scanned into chart. No changes noted.  

## 2018-09-06 NOTE — Discharge Instructions (Addendum)
Keep arm elevated over the weekend.  Work on thumb range of motion.  Loosen Ace wrap if fingers swell.  Pain medicine as directed.  Keep dressing clean and dry.   AMBULATORY SURGERY  DISCHARGE INSTRUCTIONS   1) The drugs that you were given will stay in your system until tomorrow so for the next 24 hours you should not:  A) Drive an automobile B) Make any legal decisions C) Drink any alcoholic beverage   2) You may resume regular meals tomorrow.  Today it is better to start with liquids and gradually work up to solid foods.  You may eat anything you prefer, but it is better to start with liquids, then soup and crackers, and gradually work up to solid foods.   3) Please notify your doctor immediately if you have any unusual bleeding, trouble breathing, redness and pain at the surgery site, drainage, fever, or pain not relieved by medication.    4) Additional Instructions:        Please contact your physician with any problems or Same Day Surgery at [phone], Monday through Friday 6 am to 4 pm, or [place] at [facility] number at [phone].

## 2018-09-06 NOTE — Anesthesia Procedure Notes (Signed)
Procedure Name: Intubation Performed by: Kelton Pillar, CRNA Pre-anesthesia Checklist: Patient identified, Emergency Drugs available, Patient being monitored and Suction available Patient Re-evaluated:Patient Re-evaluated prior to induction Oxygen Delivery Method: Circle system utilized Preoxygenation: Pre-oxygenation with 100% oxygen Induction Type: IV induction Ventilation: Mask ventilation without difficulty Laryngoscope Size: McGraph and 4 Grade View: Grade I Tube type: Oral Tube size: 7.0 mm Number of attempts: 1 Airway Equipment and Method: Stylet and Patient positioned with wedge pillow Placement Confirmation: ETT inserted through vocal cords under direct vision,  positive ETCO2,  CO2 detector and breath sounds checked- equal and bilateral Secured at: 21 cm Tube secured with: Tape Dental Injury: Teeth and Oropharynx as per pre-operative assessment

## 2018-09-07 NOTE — Anesthesia Postprocedure Evaluation (Signed)
Anesthesia Post Note  Patient: Acupuncturist  Procedure(s) Performed: RELEASE DORSAL COMPARTMENT (DEQUERVAIN) (Right )  Patient location during evaluation: PACU Anesthesia Type: General Level of consciousness: awake and alert and oriented Pain management: pain level controlled Vital Signs Assessment: post-procedure vital signs reviewed and stable Respiratory status: spontaneous breathing Cardiovascular status: blood pressure returned to baseline Anesthetic complications: no     Last Vitals:  Vitals:   09/06/18 1226 09/06/18 1301  BP: 140/78 132/68  Pulse: 61 68  Resp: 16 16  Temp: (!) 36.2 C   SpO2: 94% 98%    Last Pain:  Vitals:   09/07/18 0923  TempSrc:   PainSc: 8                  Marie Morales

## 2019-01-07 ENCOUNTER — Encounter: Payer: Self-pay | Admitting: Nutrition

## 2019-01-07 ENCOUNTER — Ambulatory Visit: Payer: BC Managed Care – PPO | Admitting: Nutrition

## 2019-01-07 ENCOUNTER — Encounter: Payer: BC Managed Care – PPO | Attending: Nurse Practitioner | Admitting: Nutrition

## 2019-01-07 ENCOUNTER — Other Ambulatory Visit: Payer: Self-pay

## 2019-01-07 VITALS — Ht 73.0 in | Wt 343.0 lb

## 2019-01-07 DIAGNOSIS — Z6841 Body Mass Index (BMI) 40.0 and over, adult: Secondary | ICD-10-CM | POA: Diagnosis not present

## 2019-01-07 DIAGNOSIS — R1011 Right upper quadrant pain: Secondary | ICD-10-CM | POA: Insufficient documentation

## 2019-01-07 DIAGNOSIS — I10 Essential (primary) hypertension: Secondary | ICD-10-CM | POA: Diagnosis present

## 2019-01-07 DIAGNOSIS — R739 Hyperglycemia, unspecified: Secondary | ICD-10-CM | POA: Diagnosis present

## 2019-01-07 NOTE — Patient Instructions (Signed)
Goals Follow My Plate Eat three meals per day Eat at times discussed Cut out fast food and junk food Drink only water Exercise as tolerated. Lose 1-2 lbs per week. Prepare meals at home.

## 2019-01-07 NOTE — Progress Notes (Signed)
  Medical Nutrition Therapy:  Appt start time: 1600 end time:  1700.   Assessment:  Primary concerns today: Obesity, High Blood Pressure, PreDM and Asthma.  Lives by herself. Has knee and back problems.  Wants to lose weight. Has lost about 100 lbs 7 years ago and then gained it back. Recently went through a divorce. Admits to eating  1-2 meals a day randomly. Eats late at night. Works 2 jobs. Willing to make changes with eating and lifestyle to work on weight loss and improved health.  CMP Latest Ref Rng & Units 11/14/2014 11/11/2014 05/28/2014  Glucose 65 - 99 mg/dL 169(H) 84 102(H)  BUN 6 - 20 mg/dL 9 9 8   Creatinine 0.44 - 1.00 mg/dL 0.77 0.87 0.58  Sodium 135 - 145 mmol/L 132(L) 139 139  Potassium 3.5 - 5.1 mmol/L 4.0 4.1 3.5  Chloride 101 - 111 mmol/L 106 106 108  CO2 22 - 32 mmol/L 22 21(L) 24  Calcium 8.9 - 10.3 mg/dL 8.4(L) 9.4 8.4  Total Protein 6.0 - 8.3 g/dL - - 7.1  Total Bilirubin 0.3 - 1.2 mg/dL - - 0.4  Alkaline Phos 39 - 117 U/L - - 78  AST 0 - 37 U/L - - 18  ALT 0 - 35 U/L - - 17   Lipid Panel  No results found for: CHOL, TRIG, HDL, CHOLHDL, VLDL, LDLCALC, LDLDIRECT, LABVLDL  Preferred Learning Style:   No preference indicated   Learning Readiness:   Ready  Change in progress   MEDICATIONS:   DIETARY INTAKE:  Eats 1-2 meals per day. Eats out often. Doesn't cook at home much.  Usual physical activity: ADL  Estimated energy needs: 1500  calories 135 g carbohydrates 90 g protein 33 g fat  Progress Towards Goal(s):  In progress.   Nutritional Diagnosis:  Excessive energy intake related to high fat high calorie diet as evidenced by BMI 50 and PreDM 6%.  Intervention:  Nutrition and Diabetes education provided on My Plate, CHO counting, meal planning, portion sizes, timing of meals, avoiding snacks between meals unless having a low blood sugar, target ranges for A1C and blood sugars, signs/symptoms and treatment of hyper/hypoglycemia, monitoring blood  sugars, taking medications as prescribed, benefits of exercising 30 minutes per day and prevention of complications of DM.  Goals Follow My Plate Eat three meals per day Eat at times discussed Cut out fast food and junk food Drink only water Exercise as tolerated. Lose 1-2 lbs per week. Prepare meals at home.  Teaching Method Utilized:  Visual Auditory Hands on  Handouts given during visit include:  The Plate Method  Meal Plan Card  Diabetes instructions.   Barriers to learning/adherence to lifestyle change: none  Demonstrated degree of understanding via:  Teach Back   Monitoring/Evaluation:  Dietary intake, exercise, , and body weight in 1 month(s).

## 2019-02-04 ENCOUNTER — Encounter: Payer: BC Managed Care – PPO | Admitting: Nutrition

## 2019-03-12 ENCOUNTER — Other Ambulatory Visit: Payer: Self-pay | Admitting: Nurse Practitioner

## 2019-03-12 DIAGNOSIS — Z1231 Encounter for screening mammogram for malignant neoplasm of breast: Secondary | ICD-10-CM

## 2019-04-10 ENCOUNTER — Other Ambulatory Visit (HOSPITAL_COMMUNITY): Payer: Self-pay | Admitting: Podiatry

## 2019-04-10 ENCOUNTER — Ambulatory Visit
Admission: RE | Admit: 2019-04-10 | Discharge: 2019-04-10 | Disposition: A | Discharge status: Home / Self Care | Payer: PRIVATE HEALTH INSURANCE | Source: Ambulatory Visit | Attending: Podiatry | Admitting: Podiatry

## 2019-04-10 ENCOUNTER — Other Ambulatory Visit: Payer: Self-pay | Admitting: Podiatry

## 2019-04-10 ENCOUNTER — Other Ambulatory Visit: Payer: Self-pay

## 2019-04-10 DIAGNOSIS — M79661 Pain in right lower leg: Secondary | ICD-10-CM

## 2019-04-10 DIAGNOSIS — M7989 Other specified soft tissue disorders: Secondary | ICD-10-CM

## 2019-04-24 ENCOUNTER — Ambulatory Visit: Payer: BC Managed Care – PPO

## 2019-04-25 ENCOUNTER — Other Ambulatory Visit: Payer: Self-pay | Admitting: Nurse Practitioner

## 2019-04-25 DIAGNOSIS — Z1231 Encounter for screening mammogram for malignant neoplasm of breast: Secondary | ICD-10-CM

## 2019-05-20 ENCOUNTER — Ambulatory Visit
Admission: RE | Admit: 2019-05-20 | Discharge: 2019-05-20 | Disposition: A | Discharge status: Home / Self Care | Payer: BC Managed Care – PPO | Source: Ambulatory Visit | Attending: Nurse Practitioner | Admitting: Nurse Practitioner

## 2019-05-20 DIAGNOSIS — Z1231 Encounter for screening mammogram for malignant neoplasm of breast: Secondary | ICD-10-CM | POA: Diagnosis not present

## 2019-05-23 ENCOUNTER — Other Ambulatory Visit: Payer: Self-pay | Admitting: Nurse Practitioner

## 2019-05-23 DIAGNOSIS — N6489 Other specified disorders of breast: Secondary | ICD-10-CM

## 2019-05-23 DIAGNOSIS — R928 Other abnormal and inconclusive findings on diagnostic imaging of breast: Secondary | ICD-10-CM

## 2019-06-05 ENCOUNTER — Ambulatory Visit
Admission: RE | Admit: 2019-06-05 | Discharge: 2019-06-05 | Disposition: A | Discharge status: Home / Self Care | Payer: BC Managed Care – PPO | Source: Ambulatory Visit | Attending: Nurse Practitioner | Admitting: Nurse Practitioner

## 2019-06-05 DIAGNOSIS — R928 Other abnormal and inconclusive findings on diagnostic imaging of breast: Secondary | ICD-10-CM | POA: Insufficient documentation

## 2019-06-05 DIAGNOSIS — N6489 Other specified disorders of breast: Secondary | ICD-10-CM

## 2019-06-06 ENCOUNTER — Other Ambulatory Visit: Payer: Self-pay | Admitting: Nurse Practitioner

## 2019-06-06 DIAGNOSIS — R928 Other abnormal and inconclusive findings on diagnostic imaging of breast: Secondary | ICD-10-CM

## 2019-06-06 DIAGNOSIS — N6310 Unspecified lump in the right breast, unspecified quadrant: Secondary | ICD-10-CM

## 2019-06-19 ENCOUNTER — Ambulatory Visit
Admission: RE | Admit: 2019-06-19 | Discharge: 2019-06-19 | Disposition: A | Discharge status: Home / Self Care | Payer: BC Managed Care – PPO | Source: Ambulatory Visit | Attending: Nurse Practitioner | Admitting: Nurse Practitioner

## 2019-06-19 DIAGNOSIS — N6310 Unspecified lump in the right breast, unspecified quadrant: Secondary | ICD-10-CM | POA: Diagnosis present

## 2019-06-19 DIAGNOSIS — R928 Other abnormal and inconclusive findings on diagnostic imaging of breast: Secondary | ICD-10-CM | POA: Insufficient documentation

## 2019-06-19 HISTORY — PX: BREAST BIOPSY: SHX20

## 2019-06-20 LAB — SURGICAL PATHOLOGY

## 2019-10-24 ENCOUNTER — Other Ambulatory Visit: Payer: Self-pay | Admitting: Student

## 2019-10-24 DIAGNOSIS — R1319 Other dysphagia: Secondary | ICD-10-CM

## 2019-10-24 DIAGNOSIS — K2100 Gastro-esophageal reflux disease with esophagitis, without bleeding: Secondary | ICD-10-CM

## 2019-10-25 ENCOUNTER — Other Ambulatory Visit: Payer: Self-pay

## 2019-10-25 ENCOUNTER — Ambulatory Visit
Admission: RE | Admit: 2019-10-25 | Discharge: 2019-10-25 | Disposition: A | Discharge status: Home / Self Care | Payer: BLUE CROSS/BLUE SHIELD | Source: Ambulatory Visit | Attending: Student | Admitting: Student

## 2019-10-25 DIAGNOSIS — K2100 Gastro-esophageal reflux disease with esophagitis, without bleeding: Secondary | ICD-10-CM

## 2019-10-25 DIAGNOSIS — R1310 Dysphagia, unspecified: Secondary | ICD-10-CM | POA: Diagnosis present

## 2019-10-25 DIAGNOSIS — R1319 Other dysphagia: Secondary | ICD-10-CM

## 2019-10-29 ENCOUNTER — Other Ambulatory Visit: Payer: Self-pay | Admitting: Student

## 2019-10-29 DIAGNOSIS — R933 Abnormal findings on diagnostic imaging of other parts of digestive tract: Secondary | ICD-10-CM

## 2019-10-29 DIAGNOSIS — Q387 Congenital pharyngeal pouch: Secondary | ICD-10-CM

## 2019-11-15 ENCOUNTER — Ambulatory Visit: Payer: BLUE CROSS/BLUE SHIELD

## 2019-11-19 ENCOUNTER — Ambulatory Visit
Admission: RE | Admit: 2019-11-19 | Discharge: 2019-11-19 | Disposition: A | Discharge status: Home / Self Care | Payer: BLUE CROSS/BLUE SHIELD | Source: Ambulatory Visit | Attending: Student | Admitting: Student

## 2019-11-19 ENCOUNTER — Other Ambulatory Visit: Payer: Self-pay

## 2019-11-19 DIAGNOSIS — R933 Abnormal findings on diagnostic imaging of other parts of digestive tract: Secondary | ICD-10-CM | POA: Insufficient documentation

## 2019-11-19 DIAGNOSIS — Q387 Congenital pharyngeal pouch: Secondary | ICD-10-CM | POA: Diagnosis present

## 2019-11-19 MED ORDER — IOHEXOL 300 MG/ML  SOLN
75.0000 mL | Freq: Once | INTRAMUSCULAR | Status: AC | PRN
  Administered 2019-11-19: 75 mL via INTRAVENOUS

## 2019-12-04 ENCOUNTER — Encounter: Payer: Self-pay | Admitting: Orthopaedic Surgery

## 2020-03-06 ENCOUNTER — Other Ambulatory Visit: Payer: Self-pay | Admitting: Nurse Practitioner

## 2020-03-06 DIAGNOSIS — N6489 Other specified disorders of breast: Secondary | ICD-10-CM

## 2020-03-25 ENCOUNTER — Ambulatory Visit
Admission: RE | Admit: 2020-03-25 | Discharge: 2020-03-25 | Disposition: A | Discharge status: Home / Self Care | Payer: BLUE CROSS/BLUE SHIELD | Source: Ambulatory Visit | Attending: Nurse Practitioner | Admitting: Nurse Practitioner

## 2020-03-25 ENCOUNTER — Other Ambulatory Visit: Payer: Self-pay

## 2020-03-25 DIAGNOSIS — N6489 Other specified disorders of breast: Secondary | ICD-10-CM | POA: Insufficient documentation

## 2020-07-14 ENCOUNTER — Emergency Department: Payer: BLUE CROSS/BLUE SHIELD

## 2020-07-14 ENCOUNTER — Encounter: Payer: Self-pay | Admitting: *Deleted

## 2020-07-14 ENCOUNTER — Other Ambulatory Visit: Payer: Self-pay

## 2020-07-14 ENCOUNTER — Emergency Department
Admission: EM | Admit: 2020-07-14 | Discharge: 2020-07-14 | Disposition: A | Discharge status: Home / Self Care | Payer: BLUE CROSS/BLUE SHIELD | Attending: Emergency Medicine | Admitting: Emergency Medicine

## 2020-07-14 DIAGNOSIS — M79604 Pain in right leg: Secondary | ICD-10-CM | POA: Diagnosis not present

## 2020-07-14 DIAGNOSIS — R0789 Other chest pain: Secondary | ICD-10-CM | POA: Diagnosis not present

## 2020-07-14 DIAGNOSIS — M7918 Myalgia, other site: Secondary | ICD-10-CM

## 2020-07-14 DIAGNOSIS — J45909 Unspecified asthma, uncomplicated: Secondary | ICD-10-CM | POA: Insufficient documentation

## 2020-07-14 DIAGNOSIS — M7989 Other specified soft tissue disorders: Secondary | ICD-10-CM | POA: Diagnosis present

## 2020-07-14 DIAGNOSIS — R079 Chest pain, unspecified: Secondary | ICD-10-CM

## 2020-07-14 LAB — CBC
HCT: 37.1 % (ref 36.0–46.0)
Hemoglobin: 12.1 g/dL (ref 12.0–15.0)
MCH: 28.1 pg (ref 26.0–34.0)
MCHC: 32.6 g/dL (ref 30.0–36.0)
MCV: 86.3 fL (ref 80.0–100.0)
Platelets: 384 10*3/uL (ref 150–400)
RBC: 4.3 MIL/uL (ref 3.87–5.11)
RDW: 14.7 % (ref 11.5–15.5)
WBC: 8.8 10*3/uL (ref 4.0–10.5)
nRBC: 0 % (ref 0.0–0.2)

## 2020-07-14 LAB — BASIC METABOLIC PANEL
Anion gap: 6 (ref 5–15)
BUN: 13 mg/dL (ref 6–20)
CO2: 24 mmol/L (ref 22–32)
Calcium: 8.7 mg/dL — ABNORMAL LOW (ref 8.9–10.3)
Chloride: 107 mmol/L (ref 98–111)
Creatinine, Ser: 0.61 mg/dL (ref 0.44–1.00)
GFR, Estimated: >60 mL/min (ref >60)
Glucose, Bld: 96 mg/dL (ref 70–99)
Potassium: 3.4 mmol/L — ABNORMAL LOW (ref 3.5–5.1)
Sodium: 137 mmol/L (ref 135–145)

## 2020-07-14 LAB — TROPONIN I (HIGH SENSITIVITY): Troponin I (High Sensitivity): 2 ng/L (ref <18)

## 2020-07-14 NOTE — ED Triage Notes (Signed)
Pain and swelling in right leg which has been increasing for 2 weeks.  Painful.  Pt has hx of blood clot and was on Lovenox.

## 2020-07-14 NOTE — ED Notes (Signed)
Added EKG as pt mentions that she has had intermittent CP

## 2020-07-14 NOTE — ED Notes (Signed)
Light green top, lavender and blue top sent to lab at this time.

## 2020-07-14 NOTE — ED Provider Notes (Signed)
[facility] Emergency Department Provider Note  Time seen: 9:21 PM  I have reviewed the triage vital signs and the nursing notes.   HISTORY  Chief Complaint Leg Swelling   HPI Marie Morales is a 45 y.o. female with a past medical history of arthritis, myalgias, lower extremity edema, presents to the emergency department for right leg discomfort.  According to the patient over the past week or so she has been experiencing a discomfort to the back of her right calf.  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  Patient states she has had some mild chest discomfort at times as well over the past week or so along with some pain when she lies down at night.  Denies any chest pain currently.  Patient was concerned she could possibly have a blood clot so came to the emergency department for evaluation.   Past Medical History:  Diagnosis Date  . Arthritis   . Asthma    NO INHALERS  . Asthma   . Chronic abdominal pain   . Chronic headache   . Edema    ANKLES  . Edema extremities   . Headache   . Incompetent cervix   . LLQ pain 08/22/2012  . Menorrhagia   . Myalgia 09/10/2012  . RLQ abdominal pain 09/10/2012  . RUQ pain 08/22/2012  . Trichimoniasis 08/22/2012  . Vaginal discharge 02/26/2013    Patient Active Problem List   Diagnosis Date Noted  . S/P laparoscopic hysterectomy 11/13/2014  . Vaginal discharge 02/26/2013  . Myalgia 09/10/2012  . RLQ abdominal pain 09/10/2012  . Trichimoniasis 08/22/2012  . LLQ pain 08/22/2012  . RUQ pain 08/22/2012  . Asthma 05/31/2012  . LEG PAIN 05/12/2009    Past Surgical History:  Procedure Laterality Date  . ABDOMINAL HYSTERECTOMY    . BREAST BIOPSY Right 06/19/2019   Affirm bx-"X" clip-neg  . CYSTOSCOPY N/A 11/13/2014   Procedure: CYSTOSCOPY;  Surgeon: Malachy Mood, MD;  Location: [facility] ORS;  Service: Gynecology;  Laterality: N/A;  . DILATION AND CURETTAGE OF UTERUS    .  DORSAL COMPARTMENT RELEASE Right 09/06/2018   Procedure: RELEASE DORSAL COMPARTMENT (DEQUERVAIN);  Surgeon: Hessie Knows, MD;  Location: [facility] ORS;  Service: Orthopedics;  Laterality: Right;  . ENDOMETRIAL ABLATION     endometrial ablation  . FOOT SURGERY    . KNEE ARTHROSCOPY    . KNEE SURGERY Right    lap   . LAPAROSCOPIC HYSTERECTOMY Bilateral 11/13/2014   Procedure: HYSTERECTOMY TOTAL LAPAROSCOPIC,bilateral salpingectomy;  Surgeon: Malachy Mood, MD;  Location: [facility] ORS;  Service: Gynecology;  Laterality: Bilateral;  . OVARIAN CYST REMOVAL    . OVARIAN CYST SURGERY    . TONSILLECTOMY    . TUBAL LIGATION      Prior to Admission medications   Medication Sig Start Date End Date Taking? Authorizing Provider  albuterol (VENTOLIN HFA) 108 (90 Base) MCG/ACT inhaler Inhale 2 puffs into the lungs every 6 (six) hours as needed for wheezing or shortness of breath.    [provider]  BIOTIN PO Take 1 tablet by mouth once a week.    [provider]  cetirizine (ZYRTEC) 10 MG tablet Take 10 mg by mouth daily.    [provider]  furosemide (LASIX) 40 MG tablet Take 40 mg by mouth daily.    [provider]  furosemide (LASIX) 40 MG tablet Take 40 mg by mouth daily.    [provider]  ibuprofen (ADVIL) 800 MG tablet Take 800 mg by mouth every 8 (eight) hours as needed for headache or moderate pain.    [provider]  ibuprofen (ADVIL,MOTRIN) 800 MG tablet Take 1 tablet (800 mg total) by mouth every 8 (eight) hours as needed. 11/14/14   Malachy Mood, MD  oxyCODONE-acetaminophen (PERCOCET/ROXICET) 5-325 MG tablet Take 1-2 tablets by mouth every 4 (four) hours as needed (moderate to severe pain (when tolerating fluids)). 11/14/14   Malachy Mood, MD  potassium chloride (K-DUR,KLOR-CON) 10 MEQ tablet Take 10 mEq by mouth daily.    [provider]  traMADol (ULTRAM) 50 MG tablet Take 2 tablets (100 mg total) by mouth every 6 (six)  hours as needed. 09/06/18   Hessie Knows, MD    Allergies  Allergen Reactions  . Shellfish Allergy Swelling    THROAT SWELLS    Family History  Problem Relation Age of Onset  . Hypertension Mother   . Breast cancer Mother 59  . Cancer Maternal Grandmother        breast and lung  . Diabetes Maternal Grandmother   . Breast cancer Maternal Grandmother   . Diabetes Paternal Grandmother   . Diabetes Paternal Grandfather     Social History Social History   Tobacco Use  . Smoking status: Never Smoker  . Smokeless tobacco: Never Used  Vaping Use  . Vaping Use: Never used  Substance Use Topics  . Alcohol use: Yes    Comment: RARE  . Drug use: Never    Review of Systems Constitutional: Negative for fever. Cardiovascular: Intermittent central chest pain, mild dull pain over the past week or so Respiratory: Negative for shortness of breath. Gastrointestinal: Negative for abdominal pain Genitourinary: Negative for urinary compaints Musculoskeletal: Mild right lower extremity discomfort over the past week Skin: Negative for skin complaints  Neurological: Negative for headache All other ROS negative  ____________________________________________   PHYSICAL EXAM:  VITAL SIGNS: ED Triage Vitals  Enc Vitals Group     BP 07/14/20 1919 130/82     Pulse Rate 07/14/20 1919 77     Resp 07/14/20 1919 14     Temp 07/14/20 1919 98.2 F (36.8 C)     Temp Source 07/14/20 1919 Oral     SpO2 07/14/20 1919 98 %     Weight --      Height --      Head Circumference --      Peak Flow --      Pain Score 07/14/20 1921 10     Pain Loc --      Pain Edu? --      Excl. in [place]? --     Constitutional: Alert and oriented. Well appearing and in no distress. Eyes: Normal exam ENT      Head: Normocephalic and atraumatic.      Mouth/Throat: Mucous membranes are moist. Cardiovascular: Normal rate, regular rhythm.  Respiratory: Normal respiratory effort without tachypnea nor retractions.  Breath sounds are clear Gastrointestinal: Soft and nontender. No distention Musculoskeletal: Mild tenderness palpation in the posterior right calf for the patient has a small palpable tender area consistent with small contusion or hematoma. Neurologic:  Normal speech and language. No gross focal neurologic deficits Skin:  Skin is warm, dry Psychiatric: Mood and affect are normal.   ____________________________________________    EKG  EKG viewed and interpreted by myself shows normal sinus rhythm at 64 bpm with a narrow QRS, normal axis, normal intervals, no concerning ST changes.  ____________________________________________    RADIOLOGY  Ultrasound is negative for DVT  ____________________________________________   INITIAL IMPRESSION / ASSESSMENT AND PLAN / ED COURSE  Pertinent labs & imaging results that were available during my care of the patient were reviewed by me and considered in my medical decision making (see chart for details).   Patient presents emergency department for tender area to her right posterior calf as well as some intermittent chest discomfort.  Currently the patient appears well, no distress.  Patient does have a palpable area to the right posterior calf most consistent with small hematoma or contusion possible superficial thrombophlebitis.  Reassuringly her ultrasound is negative, EKG is reassuring, lab work is reassuring including negative troponin.  Given the patient's reassuring work-up I believe she would be safe for discharge home with outpatient follow-up.  Discussed using Tylenol or ibuprofen for discomfort and warm compresses.  Marie Morales was evaluated in Emergency Department on 07/14/2020 for the symptoms described in the history of present illness. She was evaluated in the context of the global COVID-19 pandemic, which necessitated consideration that the patient might be at risk for infection with the SARS-CoV-2 virus that causes COVID-19.  Institutional protocols and algorithms that pertain to the evaluation of patients at risk for COVID-19 are in a state of rapid change based on information released by regulatory bodies including the CDC and federal and state organizations. These policies and algorithms were followed during the patient's care in the ED.  ____________________________________________   FINAL CLINICAL IMPRESSION(S) / ED DIAGNOSES  Right leg pain   Harvest Dark, MD 07/14/20 2123

## 2020-07-14 NOTE — ED Notes (Signed)
Bruising noted on R calf, cool to touch.

## 2020-07-14 NOTE — Discharge Instructions (Addendum)
As we discussed please use Tylenol or ibuprofen as needed for discomfort.  Use warm compresses to the area several times a day to promote blood flow to this area.  Please follow-up with your doctor for recheck/reevaluation.  Return to the emergency department for any symptom personally concerning to yourself.

## 2020-09-10 ENCOUNTER — Telehealth: Payer: Self-pay | Admitting: Dietician

## 2020-09-10 NOTE — Telephone Encounter (Signed)
Returned Dance movement psychotherapist from Ms. Betler to reschedule her appointment from 09/14/20 to 10/21/20. Her appointment is now booked for 10/21/20 at 9:30am.

## 2020-09-14 ENCOUNTER — Ambulatory Visit: Payer: BLUE CROSS/BLUE SHIELD | Admitting: Dietician

## 2020-10-21 ENCOUNTER — Encounter: Payer: BLUE CROSS/BLUE SHIELD | Admitting: Dietician

## 2020-10-21 ENCOUNTER — Other Ambulatory Visit: Payer: Self-pay

## 2020-10-21 VITALS — Ht 72.0 in | Wt 342.2 lb

## 2020-10-21 DIAGNOSIS — Z6841 Body Mass Index (BMI) 40.0 and over, adult: Secondary | ICD-10-CM

## 2020-10-21 DIAGNOSIS — R739 Hyperglycemia, unspecified: Secondary | ICD-10-CM | POA: Diagnosis not present

## 2020-10-21 NOTE — Patient Instructions (Signed)
Plan balanced meal with resources provided. Work on increasing daily steps as tolerated with knee pain. Consider water exercise if needed to prevent pain. Great job working on healthy changes! Keep it up!

## 2020-10-21 NOTE — Progress Notes (Signed)
Medical Nutrition Therapy: Visit start time: 0945  end time: 1050  Assessment:  Diagnosis: abnormal blood glucose Past medical history: hyperlipidemia, elevated BP, obesity Psychosocial issues/ stress concerns: chronic depression  Preferred learning method:  Auditory Visual Hands-on   Current weight: 342.2lbs Height: 6'0" BMI: 46.41 Medications, supplements: reconciled list in medical record  Progress and evaluation:  Recent HbA1C result of 6.2% (05/18/20); patient reports a more recent result which was higher than 6.2%; cannot recall exact result. Patient reports significant knee and ankle pain, also lower back pain. She has had steroid injections periodically to ease pain. She is hoping that weight loss will help improve the pain and her mobility. Reports some skipped meals or imbalanced meals due to work schedule, living alone. Loves potatoes, corn occ as meal. Does not like like large amounts of meats.  Does not like lettuce, does eat raw spinach. She has shellfish allergy; does eat fish. She has been working on [employer], less sugar in beverages and foods, and smaller food portions. She report recent weight loss of about 8lbs.  Physical activity: none at this time due to knee pain  Dietary Intake:  Usual eating pattern includes 2 meals and 5-6 snacks per day. Dining out frequency: 5-8 meals per week.  Breakfast: [employer] fries, egg and cheese biscuit/ often quick and simple ie fruit/ sweet potato/ oatmeal/ granola or nutrigrain bar; occ coffee or iced coffee with cream and sugar -- extra; likes boiled eggs Snack: same as pm Lunch: fruit/ fast foods/ salad; likes veg incl greens, broccoli, cauliflower Snack: peanuts, frozen grapes/ orther fruit/ small candy bar/ pickles/ yogurt with granola or honey and almonds Supper: sometimes skips -- only fruit or vienna sausages or can corn or potato/ sweet potato; chips; bologna sandwich; jasmine rice with brocc and cheese; some  baked chicken or seared and baked steak with ranch dressing; baked/ blackened and fried fish Snack: none Beverages: water, tea, lemonade, some sodas  Nutrition Care Education: Topics covered:  Basic nutrition: basic food groups, appropriate nutrient balance, appropriate meal and snack schedule, general nutrition guidelines    Weight control: importance of low sugar and low fat choices; portion control strategies including consuming 1/2 of restaurant meals; estimated energy needs for weight loss at 1600 kcal, provided guidance for 45% CHO, 25% pro, 30% fat; role of physical activity Advanced nutrition:  cooking techniques, dining out Diabetes:  appropriate meal and snack schedule; appropriate carb intake and balance; healthy carb choices, role of fiber, protein, fat   Nutritional Diagnosis:  [place]-2.2 Altered nutrition-related laboratory As related to pre-diabetes.  As evidenced by recent HbA1C of 6.2%. [place]-3.3 Overweight/obesity As related to excess calories, inadequate physical activity due to pain.  As evidenced by patient with current BMI of 46.41, making diet and lifestyle changes to promote weight loss and improve blood sugars.  Intervention:  Instruction and discussion as noted above. Patient has begun making changes to reduce food portions and sugar and fat intake. She is motivated to continue. Established additional goals for change with direction from patient.  Education Materials given:  [employer] guidelines for Diabetes Plate Planner with food lists, sample meal pattern Sample menus Dining out tips Visit summary with goals/ instructions   Learner/ who was taught:  Patient   Level of understanding: Verbalizes/ demonstrates competency   Demonstrated degree of understanding via:   Teach back Learning barriers: None  Willingness to learn/ readiness for change: Eager, change in progress   Monitoring and Evaluation:  Dietary intake, exercise,  BG control, and body  weight      follow up:  11/26/20 at 5pm, virtual visit

## 2020-11-26 ENCOUNTER — Other Ambulatory Visit: Payer: Self-pay

## 2020-11-26 ENCOUNTER — Encounter: Payer: BLUE CROSS/BLUE SHIELD | Admitting: Dietician

## 2021-02-10 ENCOUNTER — Encounter: Payer: Self-pay | Admitting: Dietician

## 2021-02-10 NOTE — Progress Notes (Signed)
Have not heard back from patient to reschedule her missed appointment from 11/26/20. Sent notification to referring provider.

## 2021-02-18 ENCOUNTER — Encounter: Payer: Self-pay | Admitting: Student in an Organized Health Care Education/Training Program

## 2021-02-18 ENCOUNTER — Ambulatory Visit
Payer: BLUE CROSS/BLUE SHIELD | Attending: Student in an Organized Health Care Education/Training Program | Admitting: Student in an Organized Health Care Education/Training Program

## 2021-02-18 ENCOUNTER — Other Ambulatory Visit: Payer: Self-pay

## 2021-02-18 VITALS — BP 131/79 | HR 114 | Temp 97.1°F | Resp 16 | Ht 73.0 in | Wt 320.0 lb

## 2021-02-18 DIAGNOSIS — M1711 Unilateral primary osteoarthritis, right knee: Secondary | ICD-10-CM | POA: Diagnosis present

## 2021-02-18 DIAGNOSIS — M47816 Spondylosis without myelopathy or radiculopathy, lumbar region: Secondary | ICD-10-CM | POA: Diagnosis present

## 2021-02-18 DIAGNOSIS — G894 Chronic pain syndrome: Secondary | ICD-10-CM | POA: Diagnosis present

## 2021-02-18 DIAGNOSIS — M5136 Other intervertebral disc degeneration, lumbar region: Secondary | ICD-10-CM | POA: Insufficient documentation

## 2021-02-18 DIAGNOSIS — M25561 Pain in right knee: Secondary | ICD-10-CM | POA: Insufficient documentation

## 2021-02-18 DIAGNOSIS — G8929 Other chronic pain: Secondary | ICD-10-CM | POA: Insufficient documentation

## 2021-02-18 MED ORDER — GABAPENTIN 100 MG PO CAPS: ORAL_CAPSULE | ORAL | 0 refills | Status: AC

## 2021-02-18 NOTE — Progress Notes (Signed)
Patient: Marie Morales  Service Category: E/M  Provider: Gillis Santa, MD  DOB: 1975-07-07  DOS: 02/18/2021  Referring Provider: Earnie Larsson, MD  MRN: [id number]  Setting: Ambulatory outpatient  PCP: Renee Rival, NP  Type: New Patient  Specialty: Interventional Pain Management    Location: Office  Delivery: Face-to-face     Primary Reason(s) for Visit: Encounter for initial evaluation of one or more chronic problems (new to examiner) potentially causing chronic pain, and posing a threat to normal musculoskeletal function. (Level of risk: High) CC: Back Pain (low)  HPI  Marie Morales is a 46 y.o. year old, female patient, who comes for the first time to our practice referred by Earnie Larsson, MD for our initial evaluation of her chronic pain. She has LEG PAIN; Asthma; Trichimoniasis; LLQ pain; RUQ pain; Myalgia; RLQ abdominal pain; Vaginal discharge; S/P laparoscopic hysterectomy; Lumbar spondylosis; Primary osteoarthritis of right knee; Lumbar degenerative disc disease; and Chronic pain syndrome on their problem list. Today she comes in for evaluation of her Back Pain (low)  Pain Assessment: Location: Lower Back Radiating: radiates down legs to knee and groin, worse on the right Onset: More than a month ago Duration: Chronic pain Quality: Stabbing Severity: 9 /10 (subjective, self-reported pain score)  Effect on ADL: Limits activities Timing: Constant Modifying factors: heat, ice helps BP: 131/79   HR: (!) 114  Onset and Duration: Gradual Cause of pain: Unknown Severity: Getting worse, NAS-11 at its worse: 10/10, NAS-11 at its best: 8/10, NAS-11 now: 9/10, and NAS-11 on the average: 7/10 Timing: Morning, Night, During activity or exercise, After activity or exercise, and After a period of immobility Aggravating Factors: Kneeling, Motion, Squatting, Stooping , and Twisting Alleviating Factors: Cold packs, Hot packs, and Sleeping Associated Problems: Night-time cramps, Depression,  Dizziness, Fatigue, Inability to concentrate, Nausea, Numbness, Personality changes, Spasms, Sweating, Swelling, Tingling, Weakness, Pain that wakes patient up, and Pain that does not allow patient to sleep Quality of Pain: Aching, Burning, Cramping, Deep, Disabling, Exhausting, Horrible, Sharp, Shooting, Stabbing, Throbbing, Tingling, and Uncomfortable Previous Examinations or Tests: MRI scan Previous Treatments: Chiropractic manipulations, Narcotic medications, Pool exercises, Steroid treatments by mouth, and Stretching exercises  Marie Morales is a pleasant 46 year old female who presents with a chief complaint of axial low back pain that occasionally radiates into bilateral hips as well as right knee pain related to knee osteoarthritis.  She is being referred from Dr. Trenton Gammon with [facility] for consideration of bilateral lumbar facet medial branch nerve blocks for lumbar facet arthropathy contributing to her low back pain.  She has tried anti-inflammatories in the past with limited response.  She is on prednisone every other day for management of her asthma.  She is on gabapentin 100 mg nightly.  She has also tried Celebrex in the past and takes it as needed when she has pain flare.  She has done physical therapy in the last year and continues to do home stretching exercises as regularly as she can.  Patient's lumbar MRI from [facility] shows mild to moderate facet joint arthropathy at L4-L5 contributing to her chronic mechanical low back pain.  She also has a history of right knee pain related to right knee osteoarthritis.  Status post intra-articular knee steroid injections with limited response.  We discussed right knee genicular nerve block with patient.  Risks and benefits reviewed and patient would like to trial that.  Meds   Current Outpatient Medications:    albuterol (VENTOLIN HFA) 108 (  90 Base) MCG/ACT inhaler, Inhale 2 puffs into the lungs every 6  (six) hours as needed for wheezing or shortness of breath., Disp: , Rfl:    atorvastatin (LIPITOR) 20 MG tablet, Take 1 tablet by mouth daily., Disp: , Rfl:    Azelastine-Fluticasone 137-50 MCG/ACT SUSP, SPRAY (2) SPRAYS INTO EACH NOSTRIL TWICE DAILY. - FOR THE NOSE -, Disp: , Rfl:    BIOTIN PO, Take 1 tablet by mouth once a week., Disp: , Rfl:    budesonide-formoterol (SYMBICORT) 80-4.5 MCG/ACT inhaler, Inhale into the lungs., Disp: , Rfl:    bumetanide (BUMEX) 2 MG tablet, Take 1 tablet by mouth 2 (two) times daily., Disp: , Rfl:    celecoxib (CELEBREX) 100 MG capsule, celecoxib 100 mg capsule  TAKE 1 CAPSULE BY MOUTH EVERY DAY, Disp: , Rfl:    cetirizine (ZYRTEC) 10 MG tablet, 1 tablet, Disp: , Rfl:    clobetasol cream (TEMOVATE) 0.05 %, clobetasol 0.05 % topical cream, Disp: , Rfl:    fexofenadine (ALLEGRA) 180 MG tablet, fexofenadine 180 mg tablet, Disp: , Rfl:    fluconazole (DIFLUCAN) 150 MG tablet, 1 tablet, Disp: , Rfl:    Fluocinolone Acetonide Body 0.01 % OIL, fluocinolone 0.01 % topical body oil  APPLY TOPICALLY UP TO TWICE DAILY AS NEEDED FOR ITCHY SPOTS ON SCALP., Disp: , Rfl:    gabapentin (NEURONTIN) 100 MG capsule, Take 2 capsules (200 mg total) by mouth at bedtime for 15 days, THEN 3 capsules (300 mg total) at bedtime., Disp: 120 capsule, Rfl: 0   ibuprofen (ADVIL) 800 MG tablet, Take 800 mg by mouth every 8 (eight) hours as needed for headache or moderate pain., Disp: , Rfl:    meloxicam (MOBIC) 7.5 MG tablet, 1 tablet, Disp: , Rfl:    pantoprazole (PROTONIX) 20 MG tablet, pantoprazole 20 mg tablet,delayed release, Disp: , Rfl:    potassium chloride (K-DUR,KLOR-CON) 10 MEQ tablet, Take 10 mEq by mouth daily., Disp: , Rfl:    predniSONE (DELTASONE) 5 MG tablet, Take by mouth., Disp: , Rfl:    Semaglutide (RYBELSUS) 3 MG TABS, Rybelsus 3 mg tablet, Disp: , Rfl:    senna (SENOKOT) 8.6 MG tablet, Take by mouth., Disp: , Rfl:    Spacer/Aero-Holding Chambers (OPTICHAMBER DIAMOND)  MISC, Use as directed., Disp: , Rfl:    SUMAtriptan (IMITREX) 100 MG tablet, 1 tablet as needed, Disp: , Rfl:    topiramate (TOPAMAX) 25 MG tablet, Take 1 tablet by mouth 2 (two) times daily., Disp: , Rfl:    triamcinolone cream (KENALOG) 0.1 %, 1 application to affected area, Disp: , Rfl:    venlafaxine XR (EFFEXOR-XR) 75 MG 24 hr capsule, 1 capsule with food, Disp: , Rfl:    Vitamin D, Ergocalciferol, (DRISDOL) 1.25 MG (50000 UNIT) CAPS capsule, Take 1 capsule by mouth once a week., Disp: , Rfl:   Imaging Review   Narrative FINDINGS HISTORY: LOW BACK PAIN INTO THE LEFT LEG. LUMBAR SPINE 5 VIEWS: THERE ARE FIVE LUMBAR SEGMENTS.  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  THE SPINOUS PROCESSES OF THOSE LEVELS ARE DIMINUTIVE. NO SIGNIFICANT DISK SPACE NARROWING OR FACET JOINT ARTHRITIS. 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  NO ACUTE ABNORMALITY. Complexity Note: Imaging results reviewed. Results shared with Marie Morales, using Layman's terms.  ROS  Cardiovascular: No reported cardiovascular signs or symptoms such as High blood pressure, coronary artery disease, abnormal heart rate or rhythm, heart attack, blood thinner therapy or heart weakness and/or failure Pulmonary or Respiratory: Lung problems, Wheezing and difficulty taking a deep full breath (Asthma), and Shortness of breath Neurological: No reported neurological signs or symptoms such as seizures, abnormal skin sensations, urinary and/or fecal incontinence, being born with an abnormal open spine and/or a tethered spinal cord Psychological-Psychiatric: Anxiousness and Depressed Gastrointestinal: No reported gastrointestinal signs or symptoms such as vomiting or evacuating blood, reflux, heartburn, alternating episodes of diarrhea and constipation, inflamed or scarred liver, or pancreas or irrregular and/or infrequent  bowel movements Genitourinary: No reported renal or genitourinary signs or symptoms such as difficulty voiding or producing urine, peeing blood, non-functioning kidney, kidney stones, difficulty emptying the bladder, difficulty controlling the flow of urine, or chronic kidney disease Hematological: No reported hematological signs or symptoms such as prolonged bleeding, low or poor functioning platelets, bruising or bleeding easily, hereditary bleeding problems, low energy levels due to low hemoglobin or being anemic Endocrine: No reported endocrine signs or symptoms such as high or low blood sugar, rapid heart rate due to high thyroid levels, obesity or weight gain due to slow thyroid or thyroid disease Rheumatologic: No reported rheumatological signs and symptoms such as fatigue, joint pain, tenderness, swelling, redness, heat, stiffness, decreased range of motion, with or without associated rash Musculoskeletal: Negative for myasthenia gravis, muscular dystrophy, multiple sclerosis or malignant hyperthermia Work History: Working full time  Allergies  Marie Morales is allergic to iodine, levofloxacin, and shellfish allergy.  Laboratory Chemistry Profile   Renal Lab Results  Component Value Date   BUN 13 07/14/2020   CREATININE 0.61 07/14/2020   GFRAA >60 11/14/2014   GFRNONAA >60 07/14/2020   PROTEINUR neg 10/16/2014     Electrolytes Lab Results  Component Value Date   NA 137 07/14/2020   K 3.4 (L) 07/14/2020   CL 107 07/14/2020   CALCIUM 8.7 (L) 07/14/2020     Hepatic Lab Results  Component Value Date   AST 18 05/28/2014   ALT 17 05/28/2014   ALBUMIN 3.8 05/28/2014   ALKPHOS 78 05/28/2014   LIPASE 18 05/28/2014     ID Lab Results  Component Value Date   SARSCOV2NAA NEGATIVE 09/04/2018   STAPHAUREUS  07/20/2009    NEGATIVE        The Xpert SA Assay (FDA approved for NASAL specimens only), is one component of a comprehensive surveillance program.  It is not  intended to diagnose infection nor to guide or monitor treatment.   MRSAPCR NEGATIVE 07/20/2009   PREGTESTUR NEGATIVE 11/13/2014     Bone No results found for: VD25OH, EK800LK9ZPH, XT0569VX4, IA1655VZ4, 25OHVITD1, 25OHVITD2, 25OHVITD3, TESTOFREE, TESTOSTERONE   Endocrine Lab Results  Component Value Date   GLUCOSE 96 07/14/2020   GLUCOSEU NEGATIVE 05/28/2014     Neuropathy No results found for: VITAMINB12, FOLATE, HGBA1C, HIV   CNS No results found for: COLORCSF, APPEARCSF, RBCCOUNTCSF, WBCCSF, POLYSCSF, LYMPHSCSF, EOSCSF, PROTEINCSF, GLUCCSF, JCVIRUS, CSFOLI, IGGCSF, LABACHR, ACETBL, LABACHR, ACETBL   Inflammation (CRP: Acute   ESR: Chronic) Lab Results  Component Value Date   ESRSEDRATE TEST NOT PERFORMED 08/22/2012     Rheumatology No results found for: RF, ANA, LABURIC, URICUR, LYMEIGGIGMAB, LYMEABIGMQN, HLAB27   Coagulation Lab Results  Component Value Date   INR 1.00 01/12/2010   LABPROT 13.4 01/12/2010   APTT 34 01/12/2010   PLT 384 07/14/2020   DDIMER  01/13/2010    0.42        AT THE INHOUSE ESTABLISHED CUTOFF VALUE OF 0.48 ug/mL FEU, THIS ASSAY HAS BEEN DOCUMENTED IN THE LITERATURE TO HAVE A SENSITIVITY AND NEGATIVE PREDICTIVE VALUE OF AT LEAST 98 TO 99%.  THE TEST RESULT SHOULD BE CORRELATED WITH AN ASSESSMENT OF THE CLINICAL PROBABILITY OF DVT / VTE.     Cardiovascular Lab Results  Component Value Date   HGB 12.1 07/14/2020   HCT 37.1 07/14/2020     Screening Lab Results  Component Value Date   SARSCOV2NAA NEGATIVE 09/04/2018   STAPHAUREUS  07/20/2009    NEGATIVE        The Xpert SA Assay (FDA approved for NASAL specimens only), is one component of a comprehensive surveillance program.  It is not intended to diagnose infection nor to guide or monitor treatment.   MRSAPCR NEGATIVE 07/20/2009   PREGTESTUR NEGATIVE 11/13/2014     Cancer No results found for: CEA, CA125, LABCA2   Allergens No results found for: ALMOND, APPLE,  ASPARAGUS, AVOCADO, BANANA, BARLEY, BASIL, BAYLEAF, GREENBEAN, LIMABEAN, WHITEBEAN, BEEFIGE, REDBEET, BLUEBERRY, BROCCOLI, CABBAGE, MELON, CARROT, CASEIN, CASHEWNUT, CAULIFLOWER, CELERY     Note: Lab results reviewed.  [place]  Drug: Marie Morales  reports no history of drug use. Alcohol:  reports current alcohol use. Tobacco:  reports that she has never smoked. She has never used smokeless tobacco. Medical:  has a past medical history of Arthritis, Asthma, Asthma, Chronic abdominal pain, Chronic headache, Edema, Edema extremities, Headache, Incompetent cervix, LLQ pain (08/22/2012), Menorrhagia, Myalgia (09/10/2012), RLQ abdominal pain (09/10/2012), RUQ pain (08/22/2012), Trichimoniasis (08/22/2012), and Vaginal discharge (02/26/2013). Family: family history includes Breast cancer in her maternal grandmother; Breast cancer (age of onset: 29) in her mother; Cancer in her maternal grandmother; Diabetes in her maternal grandmother, paternal grandfather, and paternal grandmother; Hypertension in her mother.  Past Surgical History:  Procedure Laterality Date   ABDOMINAL HYSTERECTOMY     BREAST BIOPSY Right 06/19/2019   Affirm bx-"X" clip-neg   CYSTOSCOPY N/A 11/13/2014   Procedure: CYSTOSCOPY;  Surgeon: Malachy Mood, MD;  Location: [facility];  Service: Gynecology;  Laterality: N/A;   DILATION AND CURETTAGE OF UTERUS     DORSAL COMPARTMENT RELEASE Right 09/06/2018   Procedure: RELEASE DORSAL COMPARTMENT (DEQUERVAIN);  Surgeon: Hessie Knows, MD;  Location: [facility];  Service: Orthopedics;  Laterality: Right;   ENDOMETRIAL ABLATION     endometrial ablation   FOOT SURGERY     KNEE ARTHROSCOPY     KNEE SURGERY Right    lap    LAPAROSCOPIC HYSTERECTOMY Bilateral 11/13/2014   Procedure: HYSTERECTOMY TOTAL LAPAROSCOPIC,bilateral salpingectomy;  Surgeon: Malachy Mood, MD;  Location: [facility];  Service: Gynecology;  Laterality: Bilateral;   OVARIAN CYST REMOVAL     OVARIAN CYST SURGERY     TONSILLECTOMY      TUBAL LIGATION     Active Ambulatory Problems    Diagnosis Date Noted   LEG PAIN 05/12/2009   Asthma 05/31/2012   Trichimoniasis 08/22/2012   LLQ pain 08/22/2012   RUQ pain 08/22/2012   Myalgia 09/10/2012   RLQ abdominal pain 09/10/2012   Vaginal discharge 02/26/2013   S/P laparoscopic hysterectomy 11/13/2014   Lumbar spondylosis 02/18/2021   Primary osteoarthritis of right knee 02/18/2021   Lumbar degenerative disc disease 02/18/2021   Chronic pain syndrome 02/18/2021   Resolved Ambulatory Problems    Diagnosis Date Noted   No Resolved Ambulatory Problems   Past Medical History:  Diagnosis Date  Arthritis    Chronic abdominal pain    Chronic headache    Edema    Edema extremities    Headache    Incompetent cervix    Menorrhagia    Constitutional Exam  General appearance: Well nourished, well developed, and well hydrated. In no apparent acute distress Vitals:   02/18/21 0955  BP: 131/79  Pulse: (!) 114  Resp: 16  Temp: (!) 97.1 F (36.2 C)  SpO2: 96%  Weight: (!) 320 lb (145.2 kg)  Height: 6' 1"  (1.854 m)   BMI Assessment: Estimated body mass index is 42.22 kg/m as calculated from the following:   Height as of this encounter: 6' 1"  (1.854 m).   Weight as of this encounter: 320 lb (145.2 kg).  BMI interpretation table: BMI level Category Range association with higher incidence of chronic pain  <18 kg/m2 Underweight   18.5-24.9 kg/m2 Ideal body weight   25-29.9 kg/m2 Overweight Increased incidence by 20%  30-34.9 kg/m2 Obese (Class I) Increased incidence by 68%  35-39.9 kg/m2 Severe obesity (Class II) Increased incidence by 136%  >40 kg/m2 Extreme obesity (Class III) Increased incidence by 254%   Patient's current BMI Ideal Body weight  Body mass index is 42.22 kg/m. Ideal body weight: 75.4 kg (166 lb 3.6 oz) Adjusted ideal body weight: 103.3 kg (227 lb 11.8 oz)   BMI Readings from Last 4 Encounters:  02/18/21 42.22 kg/m  10/21/20 46.41 kg/m   01/07/19 45.25 kg/m  09/06/18 45.08 kg/m   Wt Readings from Last 4 Encounters:  02/18/21 (!) 320 lb (145.2 kg)  10/21/20 (!) 342 lb 3.2 oz (155.2 kg)  01/07/19 (!) 343 lb (155.6 kg)  09/06/18 (!) 341 lb 11.4 oz (155 kg)    Psych/Mental status: Alert, oriented x 3 (person, place, & time)       Eyes: PERLA Respiratory: No evidence of acute respiratory distress  Lumbar Spine Area Exam  Skin & Axial Inspection: No masses, redness, or swelling Alignment: Symmetrical Functional ROM: Pain restricted ROM       Stability: No instability detected Muscle Tone/Strength: Functionally intact. No obvious neuro-muscular anomalies detected. Sensory (Neurological): Musculoskeletal pain pattern Palpation: No palpable anomalies       Provocative Tests: Hyperextension/rotation test: Positive for facet mediated pain Gait & Posture Assessment  Ambulation: Unassisted Gait: Relatively normal for age and body habitus Posture: WNL  Lower Extremity Exam    Side: Right lower extremity  Side: Left lower extremity  Stability: No instability observed          Stability: No instability observed          Skin & Extremity Inspection: Skin color, temperature, and hair growth are WNL. No peripheral edema or cyanosis. No masses, redness, swelling, asymmetry, or associated skin lesions. No contractures.  Skin & Extremity Inspection: Skin color, temperature, and hair growth are WNL. No peripheral edema or cyanosis. No masses, redness, swelling, asymmetry, or associated skin lesions. No contractures.  Functional ROM: Pain restricted ROM for knee joint          Functional ROM: Unrestricted ROM                  Muscle Tone/Strength: Functionally intact. No obvious neuro-muscular anomalies detected.  Muscle Tone/Strength: Functionally intact. No obvious neuro-muscular anomalies detected.  Sensory (Neurological): Arthropathic arthralgia        Sensory (Neurological): Unimpaired        DTR: Patellar: deferred  today Achilles: deferred today Plantar: deferred today  DTR: Patellar:  deferred today Achilles: deferred today Plantar: deferred today  Palpation: No palpable anomalies  Palpation: No palpable anomalies    Assessment  Primary Diagnosis & Pertinent Problem List: The primary encounter diagnosis was Lumbar facet arthropathy. Diagnoses of Lumbar spondylosis, Primary osteoarthritis of right knee, Lumbar degenerative disc disease, Chronic pain syndrome, and Chronic pain of right knee were also pertinent to this visit.  Visit Diagnosis (New problems to examiner): 1. Lumbar facet arthropathy   2. Lumbar spondylosis   3. Primary osteoarthritis of right knee   4. Lumbar degenerative disc disease   5. Chronic pain syndrome   6. Chronic pain of right knee    Plan of Care (Initial workup plan)    1. Lumbar facet arthropathy Marie Morales has a history of greater than 3 months of moderate to severe pain which is resulted in functional impairment.  The patient has tried various conservative therapeutic options such as NSAIDs, Tylenol, muscle relaxants, physical therapy which was inadequately effective.  Patient's pain is predominantly axial with physical exam and MRI  findings suggestive of facet arthropathy.  Lumbar facet medial branch nerve blocks were discussed with the patient.  Risks and benefits were reviewed.  Patient would like to proceed with bilateral L3, L4, L5 medial branch nerve block. -Continue with home PT  - LUMBAR FACET(MEDIAL BRANCH NERVE BLOCK) MBNB; Future  2. Lumbar spondylosis - LUMBAR FACET(MEDIAL BRANCH NERVE BLOCK) MBNB; Future  3. Primary osteoarthritis of right knee -History of right knee osteoarthritis refractory to intra-articular knee steroid therapy.  Discussed right knee genicular nerve block and possible RFA.  Risk and benefits reviewed.  Patient like to proceed. - GENICULAR NERVE BLOCK; Future  4. Lumbar degenerative disc disease -PT, celebrex prn  5.  Chronic pain syndrome  - gabapentin (NEURONTIN) 100 MG capsule; Take 2 capsules (200 mg total) by mouth at bedtime for 15 days, THEN 3 capsules (300 mg total) at bedtime.  Dispense: 120 capsule; Refill: 0 - LUMBAR FACET(MEDIAL BRANCH NERVE BLOCK) MBNB; Future - GENICULAR NERVE BLOCK; Future    Procedure Orders         LUMBAR FACET(MEDIAL BRANCH NERVE BLOCK) MBNB         GENICULAR NERVE BLOCK     Pharmacotherapy (current): Medications ordered:  Meds ordered this encounter  Medications   gabapentin (NEURONTIN) 100 MG capsule    Sig: Take 2 capsules (200 mg total) by mouth at bedtime for 15 days, THEN 3 capsules (300 mg total) at bedtime.    Dispense:  120 capsule    Refill:  0    Fill one day early if pharmacy is closed on scheduled refill date. May substitute for generic if available.   Medications administered during this visit: Marie Morales had no medications administered during this visit.  Provider-requested follow-up: Return in about 6 days (around 02/24/2021) for B/L L3,4,5 MBNB + R GNB (IV Versed, in clinic).  I spent a total of 60 minutes reviewing chart data, face-to-face evaluation with the patient, counseling and coordination of care as detailed above.   No future appointments.  Note by: Gillis Santa, MD Date: 02/18/2021; Time: 11:06 AM

## 2021-02-18 NOTE — Progress Notes (Signed)
Safety precautions to be maintained throughout the outpatient stay will include: orient to surroundings, keep bed in low position, maintain call bell within reach at all times, provide assistance with transfer out of bed and ambulation.  

## 2021-02-18 NOTE — Patient Instructions (Signed)

## 2021-02-26 IMAGING — US ULTRASOUND LEFT BREAST LIMITED
1 series · 3 of 3 positions shown · non-contrast
Comparison: Previous exam(s).

CLINICAL DATA: 42-year-old female presenting for delayed follow-up
of a probably benign asymmetry in the left breast. Additionally, she
is feeling a new lump in the left breast for the past 4 months.

EXAM:
DIGITAL DIAGNOSTIC BILATERAL MAMMOGRAM WITH CAD AND TOMO
LEFT BREAST ULTRASOUND

[Series 1: ultrasound left breast limited · 0.07mm/px · 3 of 3 slices shown]
[im 1/3]
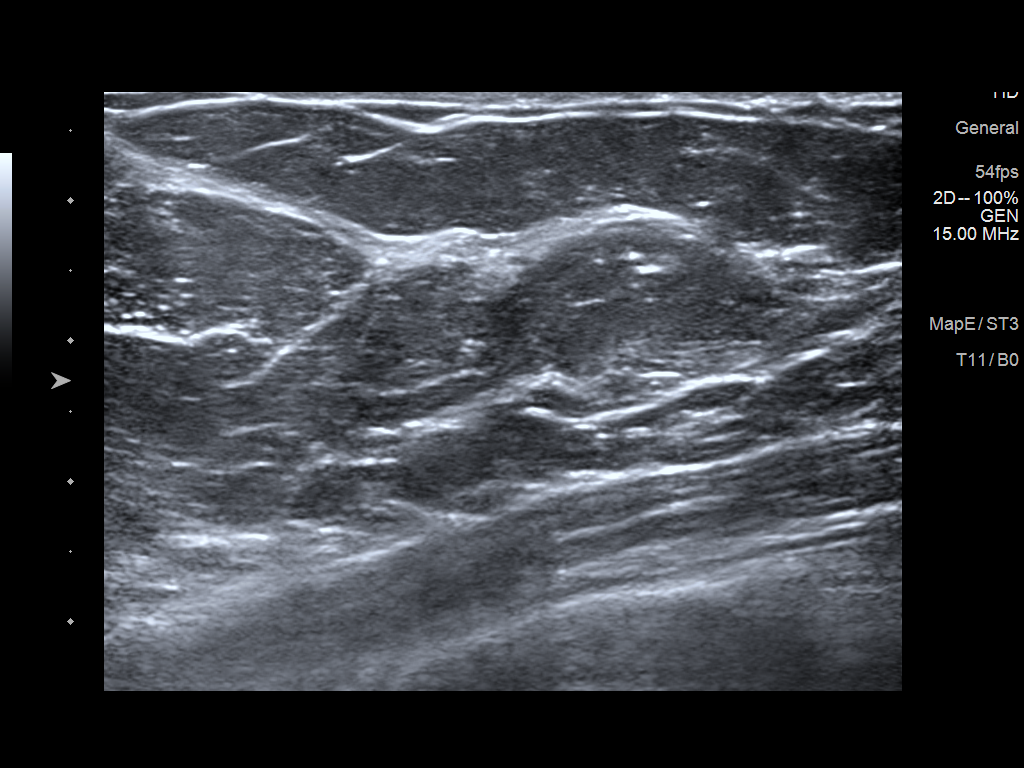
[im 2/3]
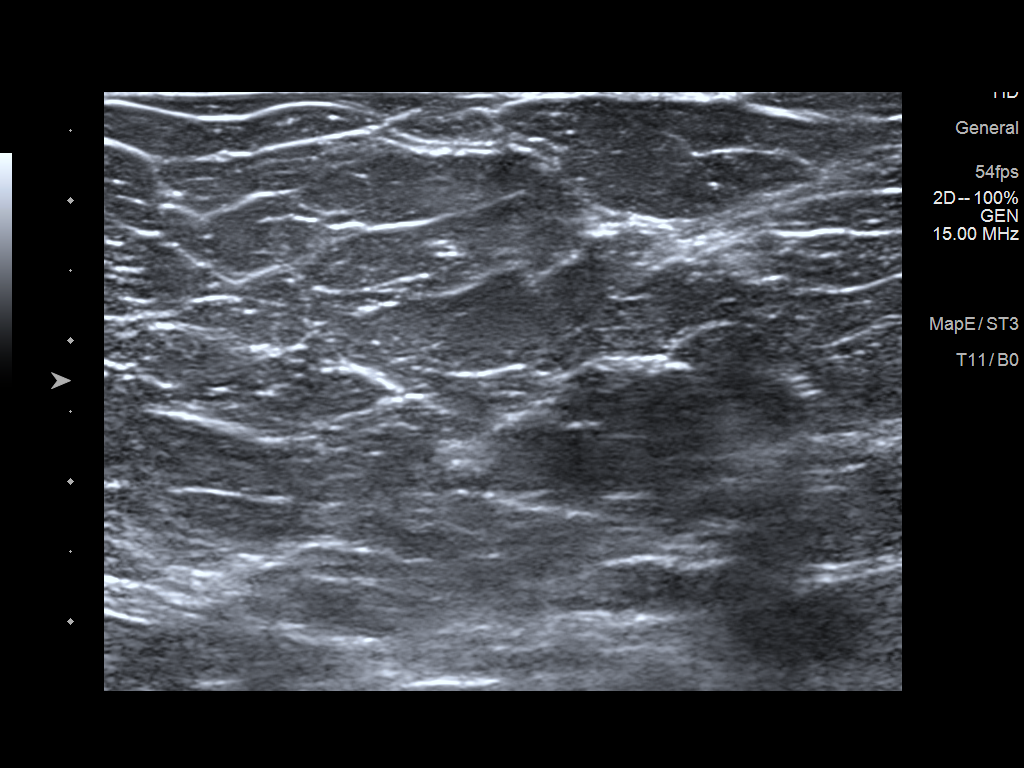
[im 3/3]
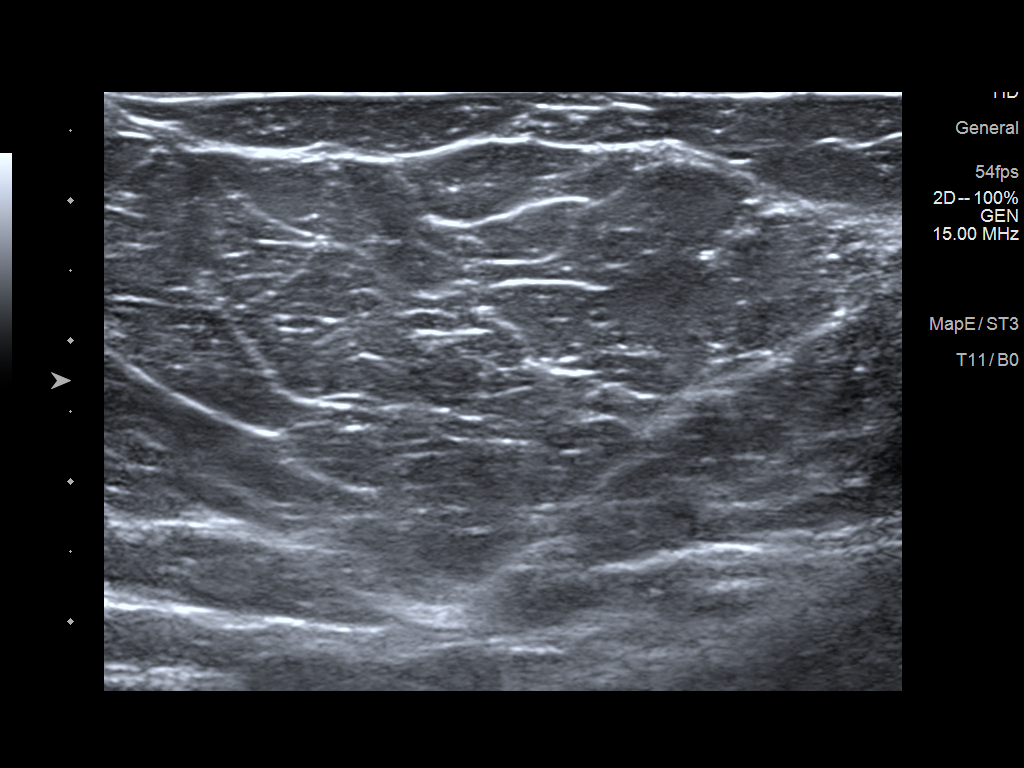

[3 of 3 positions shown; findings below may reference images not displayed]

ACR Breast Density Category c: The breast tissue is heterogeneously
dense, which may obscure small masses.
FINDINGS: The asymmetry previously seen in the central posterior left breast
on the MLO image is no longer visualized. A BB has been placed on
the lower inner aspect of the left breast indicating the palpable
site of concern. No suspicious mammographic findings are identified
deep to the palpable marker. No suspicious calcifications, masses or
areas of distortion are seen in the bilateral breasts.

Mammographic images were processed with CAD.

Physical exam of the palpable area in the lower-inner quadrant of
the left breast demonstrates no suspicious palpable masses.

Ultrasound of the lower-inner quadrant of the left breast
demonstrates normal fibroglandular tissue. No masses or suspicious
areas of shadowing are identified.
IMPRESSION: 1. No suspicious mammographic or targeted sonographic abnormalities
are identified at the palpable site of concern in the lower-inner
quadrant of the left breast.

2.  Resolution of the left breast asymmetry.

3.  No mammographic evidence of malignancy in the bilateral breasts.

RECOMMENDATION:
1. Clinical follow-up recommended for the palpable area of concern
in the left breast. Any further workup should be based on clinical
grounds.

2.  Screening mammogram in one year.(Code:[id number]-[id number])

I have discussed the findings and recommendations with the patient.
Results were also provided in writing at the conclusion of the
visit. If applicable, a reminder letter will be sent to the patient
regarding the next appointment.

BI-RADS CATEGORY  1: Negative.

## 2021-04-02 ENCOUNTER — Other Ambulatory Visit: Payer: Self-pay | Admitting: Student in an Organized Health Care Education/Training Program

## 2021-04-02 DIAGNOSIS — G894 Chronic pain syndrome: Secondary | ICD-10-CM

## 2021-07-02 ENCOUNTER — Other Ambulatory Visit: Payer: Self-pay

## 2021-07-02 ENCOUNTER — Emergency Department (HOSPITAL_COMMUNITY): Payer: BC Managed Care – PPO

## 2021-07-02 ENCOUNTER — Emergency Department (HOSPITAL_COMMUNITY)
Admission: EM | Admit: 2021-07-02 | Discharge: 2021-07-03 | Disposition: A | Discharge status: Home / Self Care | Payer: BC Managed Care – PPO | Attending: Emergency Medicine | Admitting: Emergency Medicine

## 2021-07-02 ENCOUNTER — Encounter (HOSPITAL_COMMUNITY): Payer: Self-pay

## 2021-07-02 DIAGNOSIS — M79602 Pain in left arm: Secondary | ICD-10-CM | POA: Insufficient documentation

## 2021-07-02 DIAGNOSIS — M79662 Pain in left lower leg: Secondary | ICD-10-CM | POA: Insufficient documentation

## 2021-07-02 DIAGNOSIS — S90812A Abrasion, left foot, initial encounter: Secondary | ICD-10-CM | POA: Diagnosis not present

## 2021-07-02 DIAGNOSIS — S99922A Unspecified injury of left foot, initial encounter: Secondary | ICD-10-CM | POA: Diagnosis present

## 2021-07-02 DIAGNOSIS — W19XXXA Unspecified fall, initial encounter: Secondary | ICD-10-CM | POA: Diagnosis not present

## 2021-07-02 DIAGNOSIS — T07XXXA Unspecified multiple injuries, initial encounter: Secondary | ICD-10-CM

## 2021-07-02 DIAGNOSIS — Z23 Encounter for immunization: Secondary | ICD-10-CM | POA: Insufficient documentation

## 2021-07-02 MED ORDER — IBUPROFEN 800 MG PO TABS: 800.0000 mg | ORAL_TABLET | Freq: Four times a day (QID) | ORAL | 0 refills | Status: AC | PRN

## 2021-07-02 MED ORDER — TETANUS-DIPHTH-ACELL PERTUSSIS 5-2.5-18.5 LF-MCG/0.5 IM SUSY
0.5000 mL | PREFILLED_SYRINGE | Freq: Once | INTRAMUSCULAR | Status: AC
  Administered 2021-07-02: 0.5 mL via INTRAMUSCULAR
  Filled 2021-07-02: qty 0.5

## 2021-07-02 MED ORDER — OXYCODONE-ACETAMINOPHEN 5-325 MG PO TABS
1.0000 | ORAL_TABLET | Freq: Once | ORAL | Status: AC
  Administered 2021-07-02: 1 via ORAL
  Filled 2021-07-02: qty 1

## 2021-07-02 MED ORDER — METHOCARBAMOL 500 MG PO TABS
500.0000 mg | ORAL_TABLET | Freq: Three times a day (TID) | ORAL | 0 refills | Status: AC | PRN
Start: 2021-07-02 — End: ?

## 2021-07-02 MED ORDER — IBUPROFEN 800 MG PO TABS
800.0000 mg | ORAL_TABLET | Freq: Once | ORAL | Status: AC
  Administered 2021-07-02: 800 mg via ORAL
  Filled 2021-07-02: qty 1

## 2021-07-02 NOTE — ED Triage Notes (Signed)
Pt c/o R ankle and "entire left side" pain after a slip and fall this afternoon.  Pain score 8/10.  Pt reports slipping and falling on a wet floor.  Pt ambulated to triage room w/o assistance.

## 2021-07-02 NOTE — ED Provider Notes (Signed)
[facility] EMERGENCY DEPARTMENT Provider Note   CSN: [id number] Arrival date & time: 07/02/21  1834     History  Chief Complaint  Patient presents with   Fall   Ankle Pain    Marie Morales is a 46 y.o. female.  Patient presents to the emergency department for evaluation after a fall.  Patient reports that she was walking in a store and slipped on a wet floor.  She fell predominantly on her left side.  She is having pain in both left upper and left lower extremity diffusely.  No head injury, loss of consciousness.      Home Medications Prior to Admission medications   Medication Sig Start Date End Date Taking? Authorizing Provider  ibuprofen (ADVIL) 800 MG tablet Take 1 tablet (800 mg total) by mouth every 6 (six) hours as needed for moderate pain. 07/02/21  Yes Emmani Lesueur, Gwenyth Allegra, MD  methocarbamol (ROBAXIN) 500 MG tablet Take 1 tablet (500 mg total) by mouth every 8 (eight) hours as needed for muscle spasms. 07/02/21  Yes Vernel Donlan, Gwenyth Allegra, MD  albuterol (VENTOLIN HFA) 108 (90 Base) MCG/ACT inhaler Inhale 2 puffs into the lungs every 6 (six) hours as needed for wheezing or shortness of breath.    [provider]  atorvastatin (LIPITOR) 20 MG tablet Take 1 tablet by mouth daily. 06/11/19   [provider]  Azelastine-Fluticasone 137-50 MCG/ACT SUSP SPRAY (2) SPRAYS INTO EACH NOSTRIL TWICE DAILY. - FOR THE NOSE - 07/12/19   [provider]  BIOTIN PO Take 1 tablet by mouth once a week.    [provider]  budesonide-formoterol (SYMBICORT) 80-4.5 MCG/ACT inhaler Inhale into the lungs. 11/22/18   [provider]  bumetanide (BUMEX) 2 MG tablet Take 1 tablet by mouth 2 (two) times daily. 10/09/18   [provider]  celecoxib (CELEBREX) 100 MG capsule celecoxib 100 mg capsule  TAKE 1 CAPSULE BY MOUTH EVERY DAY    [provider]  cetirizine (ZYRTEC) 10 MG tablet 1 tablet 04/25/18   [provider]   clobetasol cream (TEMOVATE) 0.05 % clobetasol 0.05 % topical cream    [provider]  fexofenadine (ALLEGRA) 180 MG tablet fexofenadine 180 mg tablet    [provider]  fluconazole (DIFLUCAN) 150 MG tablet 1 tablet 10/14/20   [provider]  Fluocinolone Acetonide Body 0.01 % OIL fluocinolone 0.01 % topical body oil  APPLY TOPICALLY UP TO TWICE DAILY AS NEEDED FOR ITCHY SPOTS ON SCALP. 12/24/19   [provider]  gabapentin (NEURONTIN) 100 MG capsule Take 2 capsules (200 mg total) by mouth at bedtime for 15 days, THEN 3 capsules (300 mg total) at bedtime. 02/18/21 04/04/21  Gillis Santa, MD  ibuprofen (ADVIL) 800 MG tablet Take 800 mg by mouth every 8 (eight) hours as needed for headache or moderate pain.    [provider]  meloxicam (MOBIC) 7.5 MG tablet 1 tablet 04/30/20   [provider]  pantoprazole (PROTONIX) 20 MG tablet pantoprazole 20 mg tablet,delayed release 09/09/19   [provider]  potassium chloride (K-DUR,KLOR-CON) 10 MEQ tablet Take 10 mEq by mouth daily.    [provider]  predniSONE (DELTASONE) 5 MG tablet Take by mouth. 10/08/20   [provider]  Semaglutide (RYBELSUS) 3 MG TABS Rybelsus 3 mg tablet    [provider]  senna (SENOKOT) 8.6 MG tablet Take by mouth. 11/22/18   [provider]  Spacer/Aero-Holding Josiah Lobo [facility] DIAMOND) MISC Use as directed.  11/22/18   [provider]  SUMAtriptan (IMITREX) 100 MG tablet 1 tablet as needed 10/17/18   [provider]  topiramate (TOPAMAX) 25 MG tablet Take 1 tablet by mouth 2 (two) times daily. 11/09/18   [provider]  triamcinolone cream (KENALOG) 0.1 % 1 application to affected area    [provider]  venlafaxine XR (EFFEXOR-XR) 75 MG 24 hr capsule 1 capsule with food    [provider]  Vitamin D, Ergocalciferol, (DRISDOL) 1.25 MG (50000 UNIT) CAPS capsule Take 1 capsule by mouth  once a week. 10/02/18   [provider]      Allergies    Iodine, Levofloxacin, and Shellfish allergy    Review of Systems   Review of Systems  Physical Exam Updated Vital Signs BP 114/63   Pulse 77   Temp 97.8 F (36.6 C) (Oral)   Resp 18   SpO2 100%  Physical Exam Vitals and nursing note reviewed.  Constitutional:      General: She is not in acute distress.    Appearance: She is well-developed.  HENT:     Head: Normocephalic and atraumatic.     Mouth/Throat:     Mouth: Mucous membranes are moist.  Eyes:     General: Vision grossly intact. Gaze aligned appropriately.     Extraocular Movements: Extraocular movements intact.     Conjunctiva/sclera: Conjunctivae normal.  Cardiovascular:     Rate and Rhythm: Normal rate and regular rhythm.     Pulses: Normal pulses.     Heart sounds: Normal heart sounds, S1 normal and S2 normal. No murmur heard.   No friction rub. No gallop.  Pulmonary:     Effort: Pulmonary effort is normal. No respiratory distress.     Breath sounds: Normal breath sounds.  Abdominal:     General: Bowel sounds are normal.     Palpations: Abdomen is soft.     Tenderness: There is no abdominal tenderness. There is no guarding or rebound.     Hernia: No hernia is present.  Musculoskeletal:        General: No swelling.     Cervical back: Full passive range of motion without pain, normal range of motion and neck supple. No spinous process tenderness or muscular tenderness. Normal range of motion.     Right lower leg: No edema.     Left lower leg: No edema.     Comments: Diffuse soft tissue tenderness of the left upper and left lower extremity without deformity.  Normal range of motion.  No swelling.  Skin:    General: Skin is warm and dry.     Capillary Refill: Capillary refill takes less than 2 seconds.     Findings: Abrasion (Left heel) present. No ecchymosis, erythema, rash or wound.  Neurological:     General: No focal deficit present.      Mental Status: She is alert and oriented to person, place, and time.     GCS: GCS eye subscore is 4. GCS verbal subscore is 5. GCS motor subscore is 6.     Cranial Nerves: Cranial nerves 2-12 are intact.     Sensory: Sensation is intact.     Motor: Motor function is intact.     Coordination: Coordination is intact.  Psychiatric:        Attention and Perception: Attention normal.        Mood and Affect: Mood normal.        Speech: Speech normal.  Behavior: Behavior normal.    ED Results / Procedures / Treatments   Labs (all labs ordered are listed, but only abnormal results are displayed) Labs Reviewed - No data to display  EKG None  Radiology DG Elbow Complete Left  Result Date: 07/02/2021 CLINICAL DATA:  Fall, pain EXAM: LEFT ELBOW - COMPLETE 3+ VIEW COMPARISON:  None Available. FINDINGS: There is no evidence of fracture, dislocation, or joint effusion. There is no evidence of arthropathy or other focal bone abnormality. Soft tissues are unremarkable. IMPRESSION: Negative. Electronically Signed   By: Rolm Baptise M.D.   On: 07/02/2021 19:38   DG Wrist Complete Left  Result Date: 07/02/2021 CLINICAL DATA:  Pain after fall EXAM: LEFT WRIST - COMPLETE 3+ VIEW COMPARISON:  None Available. FINDINGS: There is no evidence of fracture or dislocation. There is no evidence of arthropathy or other focal bone abnormality. Soft tissues are unremarkable. IMPRESSION: Negative. Electronically Signed   By: Rolm Baptise M.D.   On: 07/02/2021 19:37   DG Ankle Complete Left  Result Date: 07/02/2021 CLINICAL DATA:  Trauma, fall, pain EXAM: LEFT ANKLE COMPLETE - 3+ VIEW COMPARISON:  None Available. FINDINGS: No fracture or dislocation is seen. There is soft tissue swelling around the ankle, more so over the lateral malleolus. Small plantar spur is seen in the calcaneus. Bony spurs are noted in the dorsal aspect of talonavicular joint. IMPRESSION: No recent fracture or dislocation is seen in the left  ankle. Electronically Signed   By: Elmer Picker M.D.   On: 07/02/2021 19:42   DG Ankle Complete Right  Result Date: 07/02/2021 CLINICAL DATA:  Pain after fall EXAM: RIGHT ANKLE - COMPLETE 3+ VIEW; RIGHT FOOT COMPLETE - 3+ VIEW COMPARISON:  None Available. FINDINGS: Right ankle: Frontal, oblique, and lateral views of the right ankle are obtained. No acute displaced fracture, subluxation, or dislocation. Joint spaces are well preserved. Small inferior calcaneal spur. Diffuse soft tissue swelling greatest anteriorly and laterally. Right foot: Frontal, oblique, and lateral views of the right foot are obtained. Postsurgical changes are seen at the head of the first metatarsal. No acute fracture, subluxation, or dislocation. Mild osteoarthritis centered at the tarsometatarsal joints. Soft tissues are unremarkable. IMPRESSION: 1. No acute fracture within the right foot or ankle. 2. Anterolateral soft tissue swelling of the right ankle. 3. Midfoot osteoarthritis. 4. Small inferior calcaneal spur. Electronically Signed   By: Randa Ngo M.D.   On: 07/02/2021 19:40   DG Shoulder Left  Result Date: 07/02/2021 CLINICAL DATA:  Fall, pain EXAM: LEFT SHOULDER - 2+ VIEW COMPARISON:  None Available. FINDINGS: There is no evidence of fracture or dislocation. There is no evidence of arthropathy or other focal bone abnormality. Soft tissues are unremarkable. IMPRESSION: Negative. Electronically Signed   By: Rolm Baptise M.D.   On: 07/02/2021 19:38   DG Knee Complete 4 Views Left  Result Date: 07/02/2021 CLINICAL DATA:  Fall, pain EXAM: LEFT KNEE - COMPLETE 4+ VIEW COMPARISON:  None Available. FINDINGS: Degenerative changes most pronounced in the patellofemoral compartment with joint space narrowing and spurring. No acute bony abnormality. Specifically, no fracture, subluxation, or dislocation. No joint effusion. IMPRESSION: No acute bony abnormality. Electronically Signed   By: Rolm Baptise M.D.   On: 07/02/2021  19:39   DG Foot Complete Left  Result Date: 07/02/2021 CLINICAL DATA:  pain after a fall EXAM: LEFT FOOT - COMPLETE 3+ VIEW COMPARISON:  None Available. FINDINGS: Small plantar calcaneal spur. No acute bony abnormality. Specifically, no fracture, subluxation,  or dislocation. IMPRESSION: No acute bony abnormality. Electronically Signed   By: Rolm Baptise M.D.   On: 07/02/2021 19:39   DG Foot Complete Right  Result Date: 07/02/2021 CLINICAL DATA:  Pain after fall EXAM: RIGHT ANKLE - COMPLETE 3+ VIEW; RIGHT FOOT COMPLETE - 3+ VIEW COMPARISON:  None Available. FINDINGS: Right ankle: Frontal, oblique, and lateral views of the right ankle are obtained. No acute displaced fracture, subluxation, or dislocation. Joint spaces are well preserved. Small inferior calcaneal spur. Diffuse soft tissue swelling greatest anteriorly and laterally. Right foot: Frontal, oblique, and lateral views of the right foot are obtained. Postsurgical changes are seen at the head of the first metatarsal. No acute fracture, subluxation, or dislocation. Mild osteoarthritis centered at the tarsometatarsal joints. Soft tissues are unremarkable. IMPRESSION: 1. No acute fracture within the right foot or ankle. 2. Anterolateral soft tissue swelling of the right ankle. 3. Midfoot osteoarthritis. 4. Small inferior calcaneal spur. Electronically Signed   By: Randa Ngo M.D.   On: 07/02/2021 19:40   DG Hip Unilat With Pelvis 2-3 Views Left  Result Date: 07/02/2021 CLINICAL DATA:  Trauma, fall, pain EXAM: DG HIP (WITH OR WITHOUT PELVIS) 2-3V LEFT COMPARISON:  None Available. FINDINGS: No fracture or dislocation is seen. Phleboliths are seen in the pelvis. IMPRESSION: No fracture or dislocation is seen in the pelvis and left hip. Electronically Signed   By: Elmer Picker M.D.   On: 07/02/2021 19:41    Procedures Procedures    Medications Ordered in ED Medications  Tdap (BOOSTRIX) injection 0.5 mL (has no administration in time  range)  oxyCODONE-acetaminophen (PERCOCET/ROXICET) 5-325 MG per tablet 1 tablet (has no administration in time range)  ibuprofen (ADVIL) tablet 800 mg (has no administration in time range)    ED Course/ Medical Decision Making/ A&P                           Medical Decision Making Problems Addressed: Fall, initial encounter: acute illness or injury Multiple contusions: acute illness or injury  Amount and/or Complexity of Data Reviewed Radiology: ordered and independent interpretation performed. Decision-making details documented in ED Course.   Presents to the emergency department after a fall.  No evidence of head injury.  No evidence of spinal injury, cervical spine cleared by Nexus criteria.  Patient with multiple extremity complaints.  All areas were x-rayed and are negative for fracture.        Final Clinical Impression(s) / ED Diagnoses Final diagnoses:  Multiple contusions  Fall, initial encounter    Rx / DC Orders ED Discharge Orders          Ordered    ibuprofen (ADVIL) 800 MG tablet  Every 6 hours PRN        07/02/21 2317    methocarbamol (ROBAXIN) 500 MG tablet  Every 8 hours PRN        07/02/21 2317              Orpah Greek, MD 07/02/21 2317

## 2021-07-26 ENCOUNTER — Other Ambulatory Visit (HOSPITAL_COMMUNITY): Payer: Self-pay | Admitting: Nurse Practitioner

## 2021-07-26 DIAGNOSIS — Z1231 Encounter for screening mammogram for malignant neoplasm of breast: Secondary | ICD-10-CM

## 2021-08-11 ENCOUNTER — Other Ambulatory Visit (HOSPITAL_COMMUNITY): Payer: Self-pay | Admitting: Orthopedic Surgery

## 2021-08-30 ENCOUNTER — Ambulatory Visit (HOSPITAL_COMMUNITY): Payer: BC Managed Care – PPO

## 2021-08-30 ENCOUNTER — Ambulatory Visit (HOSPITAL_COMMUNITY)
Admission: RE | Admit: 2021-08-30 | Discharge: 2021-08-30 | Disposition: A | Discharge status: Home / Self Care | Payer: BC Managed Care – PPO | Source: Ambulatory Visit | Attending: Nurse Practitioner | Admitting: Nurse Practitioner

## 2021-08-30 DIAGNOSIS — Z1231 Encounter for screening mammogram for malignant neoplasm of breast: Secondary | ICD-10-CM | POA: Insufficient documentation

## 2021-10-13 ENCOUNTER — Encounter (HOSPITAL_BASED_OUTPATIENT_CLINIC_OR_DEPARTMENT_OTHER): Payer: Self-pay | Admitting: Orthopedic Surgery

## 2021-10-14 ENCOUNTER — Encounter (HOSPITAL_BASED_OUTPATIENT_CLINIC_OR_DEPARTMENT_OTHER): Payer: Self-pay | Admitting: Orthopedic Surgery

## 2021-10-14 ENCOUNTER — Other Ambulatory Visit: Payer: Self-pay

## 2021-10-18 NOTE — Anesthesia Preprocedure Evaluation (Addendum)
Anesthesia Evaluation  Patient identified by MRN, date of birth, ID band Patient awake    Reviewed: Allergy & Precautions, NPO status , Patient's Chart, lab work & pertinent test results  History of Anesthesia Complications Negative for: history of anesthetic complications  Airway Mallampati: II  TM Distance: >3 FB Neck ROM: Full    Dental  (+) Missing   Pulmonary asthma ,    Pulmonary exam normal        Cardiovascular negative cardio ROS Normal cardiovascular exam     Neuro/Psych  Headaches, negative psych ROS   GI/Hepatic Neg liver ROS, GERD  Medicated,  Endo/Other  Morbid obesityOn Rybelsus QD  Renal/GU negative Renal ROS  negative genitourinary   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  Reproductive/Obstetrics negative OB ROS                           Anesthesia Physical Anesthesia Plan  ASA: 3  Anesthesia Plan: General   Post-op Pain Management: Regional block* and Tylenol PO (pre-op)*   Induction: Intravenous  PONV Risk Score and Plan: 3 and Treatment may vary due to age or medical condition, Midazolam, Dexamethasone, Ondansetron and Scopolamine patch - Pre-op  Airway Management Planned: LMA  Additional Equipment: None  Intra-op Plan:   Post-operative Plan: Extubation in OR  Informed Consent: I have reviewed the patients History and Physical, chart, labs and discussed the procedure including the risks, benefits and alternatives for the proposed anesthesia with the patient or authorized representative who has indicated his/her understanding and acceptance.     Dental advisory given  Plan Discussed with: CRNA  Anesthesia Plan Comments:        Anesthesia Quick Evaluation

## 2021-10-21 ENCOUNTER — Ambulatory Visit (HOSPITAL_BASED_OUTPATIENT_CLINIC_OR_DEPARTMENT_OTHER): Payer: BC Managed Care – PPO | Admitting: Anesthesiology

## 2021-10-21 ENCOUNTER — Ambulatory Visit (HOSPITAL_BASED_OUTPATIENT_CLINIC_OR_DEPARTMENT_OTHER): Payer: BC Managed Care – PPO

## 2021-10-21 ENCOUNTER — Encounter (HOSPITAL_BASED_OUTPATIENT_CLINIC_OR_DEPARTMENT_OTHER): Payer: Self-pay | Admitting: Orthopedic Surgery

## 2021-10-21 ENCOUNTER — Other Ambulatory Visit: Payer: Self-pay

## 2021-10-21 ENCOUNTER — Ambulatory Visit (HOSPITAL_BASED_OUTPATIENT_CLINIC_OR_DEPARTMENT_OTHER)
Admission: RE | Admit: 2021-10-21 | Discharge: 2021-10-21 | Disposition: A | Discharge status: Home / Self Care | Payer: BC Managed Care – PPO | Attending: Orthopedic Surgery | Admitting: Orthopedic Surgery

## 2021-10-21 ENCOUNTER — Encounter (HOSPITAL_BASED_OUTPATIENT_CLINIC_OR_DEPARTMENT_OTHER)
Admission: RE | Disposition: A | Discharge status: Home / Self Care | Payer: Self-pay | Source: Home / Self Care | Attending: Orthopedic Surgery

## 2021-10-21 DIAGNOSIS — M76821 Posterior tibial tendinitis, right leg: Secondary | ICD-10-CM | POA: Insufficient documentation

## 2021-10-21 DIAGNOSIS — K219 Gastro-esophageal reflux disease without esophagitis: Secondary | ICD-10-CM | POA: Diagnosis not present

## 2021-10-21 HISTORY — PX: CALCANEAL OSTEOTOMY: SHX1281

## 2021-10-21 HISTORY — PX: GASTROCNEMIUS RECESSION: SHX863

## 2021-10-21 SURGERY — RECESSION, MUSCLE, GASTROCNEMIUS
Anesthesia: General | Site: Leg Lower | Laterality: Right

## 2021-10-21 MED ORDER — OXYCODONE HCL 5 MG PO TABS: 5.0000 mg | ORAL_TABLET | ORAL | 0 refills | Status: AC | PRN

## 2021-10-21 MED ORDER — ACETAMINOPHEN 500 MG PO TABS
ORAL_TABLET | ORAL | Status: AC
  Filled 2021-10-21: qty 2

## 2021-10-21 MED ORDER — OXYCODONE HCL 5 MG PO TABS
ORAL_TABLET | ORAL | Status: AC
  Filled 2021-10-21: qty 1

## 2021-10-21 MED ORDER — ONDANSETRON HCL 4 MG/2ML IJ SOLN
INTRAMUSCULAR | Status: DC | PRN
  Administered 2021-10-21: 4 mg via INTRAVENOUS

## 2021-10-21 MED ORDER — FENTANYL CITRATE (PF) 100 MCG/2ML IJ SOLN
100.0000 ug | Freq: Once | INTRAMUSCULAR | Status: AC
  Administered 2021-10-21: 100 ug via INTRAVENOUS

## 2021-10-21 MED ORDER — OXYCODONE HCL 5 MG/5ML PO SOLN: 5.0000 mg | Freq: Once | ORAL | Status: AC | PRN

## 2021-10-21 MED ORDER — MIDAZOLAM HCL 2 MG/2ML IJ SOLN
2.0000 mg | Freq: Once | INTRAMUSCULAR | Status: AC
  Administered 2021-10-21: 2 mg via INTRAVENOUS

## 2021-10-21 MED ORDER — FENTANYL CITRATE (PF) 100 MCG/2ML IJ SOLN
INTRAMUSCULAR | Status: AC
  Filled 2021-10-21: qty 2

## 2021-10-21 MED ORDER — SCOPOLAMINE 1 MG/3DAYS TD PT72
1.0000 | MEDICATED_PATCH | Freq: Once | TRANSDERMAL | Status: DC
  Administered 2021-10-21: 1.5 mg via TRANSDERMAL

## 2021-10-21 MED ORDER — FENTANYL CITRATE (PF) 100 MCG/2ML IJ SOLN
INTRAMUSCULAR | Status: DC | PRN
  Administered 2021-10-21 (×2): 25 ug via INTRAVENOUS
  Administered 2021-10-21: 50 ug via INTRAVENOUS
  Administered 2021-10-21 (×2): 25 ug via INTRAVENOUS

## 2021-10-21 MED ORDER — DEXAMETHASONE SODIUM PHOSPHATE 10 MG/ML IJ SOLN
INTRAMUSCULAR | Status: DC | PRN
  Administered 2021-10-21: 4 mg via INTRAVENOUS

## 2021-10-21 MED ORDER — 0.9 % SODIUM CHLORIDE (POUR BTL) OPTIME
TOPICAL | Status: DC | PRN
  Administered 2021-10-21: 200 mL

## 2021-10-21 MED ORDER — ACETAMINOPHEN 500 MG PO TABS
1000.0000 mg | ORAL_TABLET | Freq: Once | ORAL | Status: AC
  Administered 2021-10-21: 1000 mg via ORAL

## 2021-10-21 MED ORDER — MIDAZOLAM HCL 2 MG/2ML IJ SOLN
INTRAMUSCULAR | Status: AC
  Filled 2021-10-21: qty 2

## 2021-10-21 MED ORDER — PROPOFOL 10 MG/ML IV BOLUS
INTRAVENOUS | Status: DC | PRN
  Administered 2021-10-21: 200 mg via INTRAVENOUS

## 2021-10-21 MED ORDER — SCOPOLAMINE 1 MG/3DAYS TD PT72
MEDICATED_PATCH | TRANSDERMAL | Status: AC
  Filled 2021-10-21: qty 1

## 2021-10-21 MED ORDER — FENTANYL CITRATE (PF) 100 MCG/2ML IJ SOLN
25.0000 ug | INTRAMUSCULAR | Status: DC | PRN
  Administered 2021-10-21 (×3): 50 ug via INTRAVENOUS

## 2021-10-21 MED ORDER — CEFAZOLIN IN SODIUM CHLORIDE 3-0.9 GM/100ML-% IV SOLN
3.0000 g | INTRAVENOUS | Status: AC
  Administered 2021-10-21: 3 g via INTRAVENOUS

## 2021-10-21 MED ORDER — LIDOCAINE 2% (20 MG/ML) 5 ML SYRINGE
INTRAMUSCULAR | Status: DC | PRN
  Administered 2021-10-21: 100 mg via INTRAVENOUS

## 2021-10-21 MED ORDER — PROPOFOL 500 MG/50ML IV EMUL
INTRAVENOUS | Status: DC | PRN
  Administered 2021-10-21: 25 ug/kg/min via INTRAVENOUS

## 2021-10-21 MED ORDER — RIVAROXABAN 10 MG PO TABS: 10.0000 mg | ORAL_TABLET | Freq: Every day | ORAL | 0 refills | Status: AC

## 2021-10-21 MED ORDER — AMISULPRIDE (ANTIEMETIC) 5 MG/2ML IV SOLN: 10.0000 mg | Freq: Once | INTRAVENOUS | Status: DC | PRN

## 2021-10-21 MED ORDER — VANCOMYCIN HCL 500 MG IV SOLR
INTRAVENOUS | Status: DC | PRN
  Administered 2021-10-21: 500 mg via TOPICAL

## 2021-10-21 MED ORDER — VANCOMYCIN HCL 500 MG IV SOLR
INTRAVENOUS | Status: AC
  Filled 2021-10-21: qty 10

## 2021-10-21 MED ORDER — LACTATED RINGERS IV SOLN: INTRAVENOUS | Status: DC

## 2021-10-21 MED ORDER — DOCUSATE SODIUM 100 MG PO CAPS: 100.0000 mg | ORAL_CAPSULE | Freq: Two times a day (BID) | ORAL | 0 refills | Status: AC

## 2021-10-21 MED ORDER — SENNA 8.6 MG PO TABS: 2.0000 | ORAL_TABLET | Freq: Two times a day (BID) | ORAL | 0 refills | Status: AC

## 2021-10-21 MED ORDER — CLONIDINE HCL (ANALGESIA) 100 MCG/ML EP SOLN
EPIDURAL | Status: DC | PRN
  Administered 2021-10-21: 33 ug
  Administered 2021-10-21: 67 ug

## 2021-10-21 MED ORDER — BUPIVACAINE-EPINEPHRINE 0.5% -1:200000 IJ SOLN
INTRAMUSCULAR | Status: DC | PRN
  Administered 2021-10-21: 10 mL

## 2021-10-21 MED ORDER — BUPIVACAINE-EPINEPHRINE (PF) 0.5% -1:200000 IJ SOLN
INTRAMUSCULAR | Status: DC | PRN
  Administered 2021-10-21: 20 mL via PERINEURAL
  Administered 2021-10-21: 10 mL via PERINEURAL

## 2021-10-21 MED ORDER — BUPIVACAINE-EPINEPHRINE (PF) 0.5% -1:200000 IJ SOLN
INTRAMUSCULAR | Status: AC
  Filled 2021-10-21: qty 30

## 2021-10-21 MED ORDER — OXYCODONE HCL 5 MG PO TABS
5.0000 mg | ORAL_TABLET | Freq: Once | ORAL | Status: AC | PRN
  Administered 2021-10-21: 5 mg via ORAL

## 2021-10-21 MED ORDER — CEFAZOLIN IN SODIUM CHLORIDE 3-0.9 GM/100ML-% IV SOLN
INTRAVENOUS | Status: AC
  Filled 2021-10-21: qty 100

## 2021-10-21 SURGICAL SUPPLY — 96 items
APL PRP STRL LF DISP 70% ISPRP (MISCELLANEOUS) ×3
BANDAGE ESMARK 6X9 LF (GAUZE/BANDAGES/DRESSINGS) IMPLANT
BIT DRILL 2.7XCANN QCK CNCT (BIT) IMPLANT
BIT DRILL CANN 2.7 (BIT) ×3
BIT DRL 2.7XCANN QCK CNCT (BIT) ×3
BLADE AVERAGE 25X9 (BLADE) IMPLANT
BLADE MICRO SAGITTAL (BLADE) ×4 IMPLANT
BLADE SURG 15 STRL LF DISP TIS (BLADE) ×8 IMPLANT
BLADE SURG 15 STRL SS (BLADE) ×12
BNDG CMPR 9X6 STRL LF SNTH (GAUZE/BANDAGES/DRESSINGS)
BNDG ELASTIC 4X5.8 VLCR STR LF (GAUZE/BANDAGES/DRESSINGS) ×4 IMPLANT
BNDG ELASTIC 6X5.8 VLCR STR LF (GAUZE/BANDAGES/DRESSINGS) ×4 IMPLANT
BNDG ESMARK 6X9 LF (GAUZE/BANDAGES/DRESSINGS)
BOOT STEPPER DURA LG (SOFTGOODS) IMPLANT
BOOT STEPPER DURA MED (SOFTGOODS) IMPLANT
BOOT STEPPER DURA XLG (SOFTGOODS) IMPLANT
CANISTER SUCT 1200ML W/VALVE (MISCELLANEOUS) ×4 IMPLANT
CHLORAPREP W/TINT 26 (MISCELLANEOUS) ×4 IMPLANT
COVER BACK TABLE 60X90IN (DRAPES) ×4 IMPLANT
CUFF TOURN SGL QUICK 34 (TOURNIQUET CUFF)
CUFF TOURN SGL QUICK 44 (TOURNIQUET CUFF) ×4 IMPLANT
CUFF TRNQT CYL 34X4.125X (TOURNIQUET CUFF) IMPLANT
DRAPE EXTREMITY T 121X128X90 (DISPOSABLE) ×4 IMPLANT
DRAPE OEC MINIVIEW 54X84 (DRAPES) ×4 IMPLANT
DRAPE U-SHAPE 47X51 STRL (DRAPES) ×4 IMPLANT
DRSG MEPITEL 4X7.2 (GAUZE/BANDAGES/DRESSINGS) ×4 IMPLANT
ELECT REM PT RETURN 9FT ADLT (ELECTROSURGICAL) ×3
ELECTRODE REM PT RTRN 9FT ADLT (ELECTROSURGICAL) ×4 IMPLANT
GAUZE PAD ABD 8X10 STRL (GAUZE/BANDAGES/DRESSINGS) ×8 IMPLANT
GAUZE SPONGE 4X4 12PLY STRL (GAUZE/BANDAGES/DRESSINGS) ×4 IMPLANT
GLOVE BIO SURGEON STRL SZ8 (GLOVE) ×4 IMPLANT
GLOVE BIOGEL PI IND STRL 7.0 (GLOVE) IMPLANT
GLOVE BIOGEL PI IND STRL 8 (GLOVE) ×8 IMPLANT
GLOVE ECLIPSE 8.0 STRL XLNG CF (GLOVE) ×4 IMPLANT
GLOVE SURG SS PI 6.5 STRL IVOR (GLOVE) IMPLANT
GLOVE SURG SS PI 7.0 STRL IVOR (GLOVE) IMPLANT
GOWN STRL REUS W/ TWL LRG LVL3 (GOWN DISPOSABLE) ×4 IMPLANT
GOWN STRL REUS W/ TWL XL LVL3 (GOWN DISPOSABLE) ×8 IMPLANT
GOWN STRL REUS W/TWL LRG LVL3 (GOWN DISPOSABLE) ×6
GOWN STRL REUS W/TWL XL LVL3 (GOWN DISPOSABLE) ×6
K-WIRE TROC 1.25X150 (WIRE) ×3
KIT DRILL ACCESSORY 6 (BIT) IMPLANT
KIT TRANSTIBIAL (DISPOSABLE) IMPLANT
KWIRE TROC 1.25X150 (WIRE) IMPLANT
MAT PREVALON FULL STRYKER (MISCELLANEOUS) IMPLANT
NDL HYPO 25X1 1.5 SAFETY (NEEDLE) IMPLANT
NDL SUT 6 .5 CRC .975X.05 MAYO (NEEDLE) IMPLANT
NEEDLE HYPO 22GX1.5 SAFETY (NEEDLE) IMPLANT
NEEDLE HYPO 25X1 1.5 SAFETY (NEEDLE) IMPLANT
NEEDLE MAYO TAPER (NEEDLE) ×3
NS IRRIG 1000ML POUR BTL (IV SOLUTION) ×4 IMPLANT
PACK BASIN DAY SURGERY FS (CUSTOM PROCEDURE TRAY) ×4 IMPLANT
PAD CAST 4YDX4 CTTN HI CHSV (CAST SUPPLIES) ×4 IMPLANT
PADDING CAST ABS COTTON 4X4 ST (CAST SUPPLIES) IMPLANT
PADDING CAST COTTON 4X4 STRL (CAST SUPPLIES) ×3
PADDING CAST COTTON 6X4 STRL (CAST SUPPLIES) ×4 IMPLANT
PASSER SUT SWANSON 36MM LOOP (INSTRUMENTS) IMPLANT
PENCIL SMOKE EVACUATOR (MISCELLANEOUS) ×4 IMPLANT
RETRIEVER SUT HEWSON (MISCELLANEOUS) IMPLANT
SANITIZER HAND PURELL 535ML FO (MISCELLANEOUS) ×4 IMPLANT
SCREW CANN 4.0X50 (Screw) ×6 IMPLANT
SCREW CANN PT 50X4 NS SM (Screw) IMPLANT
SCREW QUOTTRO BOLT 7X14 (Screw) IMPLANT
SHEET MEDIUM DRAPE 40X70 STRL (DRAPES) ×4 IMPLANT
SLEEVE SCD COMPRESS KNEE MED (STOCKING) ×4 IMPLANT
SPIKE FLUID TRANSFER (MISCELLANEOUS) IMPLANT
SPLINT PLASTER CAST FAST 5X30 (CAST SUPPLIES) ×80 IMPLANT
SPONGE T-LAP 18X18 ~~LOC~~+RFID (SPONGE) ×4 IMPLANT
STOCKINETTE 6  STRL (DRAPES) ×3
STOCKINETTE 6 STRL (DRAPES) ×4 IMPLANT
SUCTION FRAZIER HANDLE 10FR (MISCELLANEOUS) ×3
SUCTION TUBE FRAZIER 10FR DISP (MISCELLANEOUS) ×4 IMPLANT
SUT BONE WAX W31G (SUTURE) IMPLANT
SUT ETHIBOND 0 MO6 C/R (SUTURE) IMPLANT
SUT ETHIBOND 2 OS 4 DA (SUTURE) IMPLANT
SUT ETHILON 3 0 PS 1 (SUTURE) ×4 IMPLANT
SUT FIBERWIRE #2 38 T-5 BLUE (SUTURE)
SUT FIBERWIRE 2-0 18 17.9 3/8 (SUTURE)
SUT MERSILENE 2.0 SH NDLE (SUTURE) IMPLANT
SUT MNCRL AB 3-0 PS2 18 (SUTURE) ×4 IMPLANT
SUT VIC AB 2-0 SH 18 (SUTURE) IMPLANT
SUT VIC AB 2-0 SH 27 (SUTURE) ×3
SUT VIC AB 2-0 SH 27XBRD (SUTURE) IMPLANT
SUT VICRYL 0 SH 27 (SUTURE) ×4 IMPLANT
SUT VICRYL 0 UR6 27IN ABS (SUTURE) IMPLANT
SUTURE FIBERWR #2 38 T-5 BLUE (SUTURE) IMPLANT
SUTURE FIBERWR 2-0 18 17.9 3/8 (SUTURE) IMPLANT
SUTURE TAPE 1.3 FIBERLOP 20 ST (SUTURE) IMPLANT
SUTURETAPE 1.3 FIBERLOOP 20 ST (SUTURE)
SYR BULB EAR ULCER 3OZ GRN STR (SYRINGE) ×4 IMPLANT
SYR BULB IRRIG 60ML STRL (SYRINGE) IMPLANT
SYR CONTROL 10ML LL (SYRINGE) IMPLANT
TOWEL GREEN STERILE FF (TOWEL DISPOSABLE) ×8 IMPLANT
TUBE CONNECTING 20X1/4 (TUBING) ×4 IMPLANT
UNDERPAD 30X36 HEAVY ABSORB (UNDERPADS AND DIAPERS) ×4 IMPLANT
YANKAUER SUCT BULB TIP NO VENT (SUCTIONS) IMPLANT

## 2021-10-21 NOTE — Progress Notes (Signed)
Assisted Dr. Daiva Huge with right, adductor canal, popliteal, ultrasound guided block. Side rails up, monitors on throughout procedure. See vital signs in flow sheet. Tolerated Procedure well.

## 2021-10-21 NOTE — Discharge Instructions (Addendum)
Marie Simmer, MD [facility]  Please read the following information regarding your care after surgery.  Medications  You only need a prescription for the narcotic pain medicine (ex. oxycodone, Percocet, Norco).  All of the other medicines listed below are available over the counter. ? Aleve 2 pills twice a day for the first 3 days after surgery. ? acetominophen (Tylenol) 650 mg every 4-6 hours as you need for minor to moderate pain. NEXT TYLENOL may be taken at 1:00 PM.  ? oxycodone as prescribed for severe pain  Narcotic pain medicine (ex. oxycodone, Percocet, Vicodin) will cause constipation.  To prevent this problem, take the following medicines while you are taking any pain medicine. ? docusate sodium (Colace) 100 mg twice a day ? senna (Senokot) 2 tablets twice a day  ? To help prevent blood clots, take a Xarelto as prescribed for two weeks after surgery.  You should also get up every hour while you are awake to move around.    Weight Bearing ? Do not bear any weight on the operated leg or foot.  Cast / Splint / Dressing ? Keep your splint, cast or dressing clean and dry.  Don't put anything (coat hanger, pencil, etc) down inside of it.  If it gets damp, use a hair dryer on the cool setting to dry it.  If it gets soaked, call the office to schedule an appointment for a cast change.   After your dressing, cast or splint is removed; you may shower, but do not soak or scrub the wound.  Allow the water to run over it, and then gently pat it dry.  Swelling It is normal for you to have swelling where you had surgery.  To reduce swelling and pain, keep your toes above your nose for at least 3 days after surgery.  It may be necessary to keep your foot or leg elevated for several weeks.  If it hurts, it should be elevated.  Follow Up Call my office at [phone] when you are discharged from the hospital or surgery center to schedule an appointment to be seen two weeks after surgery.  Call  my office at [phone] if you develop a fever >101.5 F, nausea, vomiting, bleeding from the surgical site or severe pain.     Post Anesthesia Home Care Instructions  Activity: Get plenty of rest for the remainder of the day. A responsible individual must stay with you for 24 hours following the procedure.  For the next 24 hours, DO NOT: -Drive a car -Paediatric nurse -Drink alcoholic beverages -Take any medication unless instructed by your physician -Make any legal decisions or sign important papers.  Meals: Start with liquid foods such as gelatin or soup. Progress to regular foods as tolerated. Avoid greasy, spicy, heavy foods. If nausea and/or vomiting occur, drink only clear liquids until the nausea and/or vomiting subsides. Call your physician if vomiting continues.  Special Instructions/Symptoms: Your throat may feel dry or sore from the anesthesia or the breathing tube placed in your throat during surgery. If this causes discomfort, gargle with warm salt water. The discomfort should disappear within 24 hours.  If you had a scopolamine patch placed behind your ear for the management of post- operative nausea and/or vomiting:  1. The medication in the patch is effective for 72 hours, after which it should be removed.  Wrap patch in a tissue and discard in the trash. Wash hands thoroughly with soap and water. 2. You may remove the patch earlier than  72 hours if you experience unpleasant side effects which may include dry mouth, dizziness or visual disturbances. 3. Avoid touching the patch. Wash your hands with soap and water after contact with the patch.  Regional Anesthesia Blocks  1. Numbness or the inability to move the "blocked" extremity may last from 3-48 hours after placement. The length of time depends on the medication injected and your individual response to the medication. If the numbness is not going away after 48 hours, call your surgeon.  2. The extremity that is  blocked will need to be protected until the numbness is gone and the  Strength has returned. Because you cannot feel it, you will need to take extra care to avoid injury. Because it may be weak, you may have difficulty moving it or using it. You may not know what position it is in without looking at it while the block is in effect.  3. For blocks in the legs and feet, returning to weight bearing and walking needs to be done carefully. You will need to wait until the numbness is entirely gone and the strength has returned. You should be able to move your leg and foot normally before you try and bear weight or walk. You will need someone to be with you when you first try to ensure you do not fall and possibly risk injury.  4. Bruising and tenderness at the needle site are common side effects and will resolve in a few days.  5. Persistent numbness or new problems with movement should be communicated to the surgeon or the [place] [phone] [place] [phone]).

## 2021-10-21 NOTE — Anesthesia Postprocedure Evaluation (Signed)
Anesthesia Post Note  Patient: ELESHA THEDFORD  Procedure(s) Performed: GASTROCNEMIUS RECESSION (Right: Leg Lower) CALCANEAL OSTEOTOMY (Right: Foot) FLEXOR DIGITORUM LONGUS TENDON TO NAVICULAR TRANSFER, POSTERIOR TIBIAL TENDON TENOLYSIS AND DELTOID LIGAMENT REPAIR (Right: Ankle)     Patient location during evaluation: PACU Anesthesia Type: General Level of consciousness: awake and alert Pain management: pain level controlled Vital Signs Assessment: post-procedure vital signs reviewed and stable Respiratory status: spontaneous breathing, nonlabored ventilation and respiratory function stable Cardiovascular status: blood pressure returned to baseline Postop Assessment: no apparent nausea or vomiting Anesthetic complications: no   No notable events documented.  Last Vitals:  Vitals:   10/21/21 1015 10/21/21 1032  BP: 130/85 134/63  Pulse: (!) 57 76  Resp: 16   Temp:  (!) 36.1 C  SpO2: 95% 96%    Last Pain:  Vitals:   10/21/21 1032  TempSrc: Oral  PainSc: [place]

## 2021-10-21 NOTE — Op Note (Signed)
10/21/2021  9:09 AM  PATIENT:  Marie Morales  46 y.o. female  PRE-OPERATIVE DIAGNOSIS:  1.  Right posterior tibial tendonitis      2.  Short right achilles tendon  POST-OPERATIVE DIAGNOSIS:  same      3.  Right deltoid ligament (calcaneonavicular portion) tear  Procedure(s): 1.  Right gastrocnemius recession through separate incision 2.  Right calcaneus medializing osteotomy through separate incision 3.  Right posterior tibial tendon tenolysis 4.  Right deltoid (calcaneonavicular portion) ligament repair 5.  Deep transfer of the flexor digitorum longus tendon to the navicular 6.  Right foot AP, lateral and Harris heel radiographs  SURGEON:  Wylene Simmer, MD  ASSISTANT: Mechele Claude, PA-C  ANESTHESIA:   General, regional  EBL:  minimal   TOURNIQUET:   Total Tourniquet Time Documented: Thigh (Right) - 69 minutes Total: Thigh (Right) - 69 minutes  COMPLICATIONS:  None apparent  DISPOSITION:  Extubated, awake and stable to recovery.  INDICATION FOR PROCEDURE: The patient is a 46 year old female with a long history of right medial ankle and hindfoot pain due to posterior tibial tendinitis.  She also has a tight heel cord.  She has failed nonoperative treatment to date including activity modification, oral anti-inflammatories, bracing and physical therapy.  She presents now for surgical treatment of these painful and limiting right foot conditions.  After pre operative consent was obtained, and the correct operative site was identified, the patient was brought to the operating room and placed supine on the OR table.  Anesthesia was administered.  Pre-operative antibiotics were administered.  A surgical timeout was taken.   PROCEDURE IN DETAIL:  After pre operative consent was obtained, and the correct operative site was identified, the patient was brought to the operating room and placed supine on the OR table.  Anesthesia was administered.  Pre-operative antibiotics were  administered.  A surgical timeout was taken.  The right lower extremity was prepped and draped in standard sterile fashion with a tourniquet around the thigh.  The extremity was elevated, and the tourniquet was inflated to 350 mmHg.  An incision was then made over the medial calf.  Dissection was carried sharply down through the subcutaneous tissues.  The superficial fascia was incised.  The gastrocnemius tendon was identified.  It was divided from medial to lateral under direct vision.  The wound was irrigated and sprinkled with vancomycin powder.  Subcutaneous tissues were closed with Monocryl, and the skin was closed with nylon.  Attention was turned to the lateral hindfoot where an oblique incision was made over the lateral wall of the calcaneus.  Dissection was carried sharply down through the subcutaneous tissues.  The periosteum was incised and elevated.  A K wire was inserted to mark the osteotomy.  A radiograph confirmed the osteotomy site.  The oscillating saw was used to cut through the isthmus of the calcaneus taking care to protect surrounding soft tissues.  The tuberosity was translated medially and fixed with two 4 mm partially-threaded Zimmer [employer] cannulated screws.  The wounds were irrigated and sprinkled with vancomycin powder.  The osteotomy site was smoothed.  The incision s were closed with nylon.  Attention was turned to the medial hindfoot.  An incision was made from the medial malleolus to the navicular along the course of the posterior tibial tendon.  Dissection was carried sharply down through the subcutaneous tissues.  The posterior tibial tendon sheath was incised.  There was dense adhesion of the tendon to the surrounding soft tissues.  The tendon was mobilized.  Careful tenolysis was performed with scissors removing all scar tissue and synovitis.  The spring ligament was noted to be torn at the dorsal half of the talonavicular joint.  Plantarly it was intact.  0 Vicryl imbricating  sutures were placed through the flaps of ligament after debriding with a curette.  The talonavicular joint was reduced and the sutures tied securely.  The FDL tendon sheath was identified.  The tendon was dissected down to the knot of Henry and transected under direct vision.  A whipstitch was placed in the end of the tendon.  A 6 mm hole was drilled in the navicular after confirming the appropriate position with an oblique radiograph.  The tendon was pulled from plantar to dorsal through the hole and secured with a 7 mm Zimmer [facility] medical bolt.  Final AP, lateral and Harris heel radiographs confirmed appropriate correction of the flatfoot deformity in appropriate position and length of all hardware.  The wounds were irrigated copiously and sprinkled with vancomycin powder.  Deep subcutaneous tissues were approximated with Vicryl.  Skin incision medially was closed with running 3-0 nylon.  Sterile dressings were applied followed by a well-padded short leg splint.  The tourniquet was released after application of the dressings.  The patient was awakened from anesthesia and transported to the recovery room in stable condition.   FOLLOW UP PLAN: Nonweightbearing on the right lower extremity.  Xarelto for DVT prophylaxis.  Follow-up in the office in 2 weeks for suture removal and conversion to a short leg cast.  Plan 6 weeks postoperative nonweightbearing immobilization.   RADIOGRAPHS: AP, lateral and Harris heel radiographs of the right foot are obtained intraoperatively.  These show interval correction of the flatfoot deformity with appropriate position and length of all hardware.  No other acute injuries are noted.    Mechele Claude PA-C was present and scrubbed for the duration of the operative case. His assistance was essential in positioning the patient, prepping and draping, gaining and maintaining exposure, performing the operation, closing and dressing the wounds and applying the  splint.

## 2021-10-21 NOTE — Anesthesia Procedure Notes (Signed)
Anesthesia Regional Block: Popliteal block   Pre-Anesthetic Checklist: , timeout performed,  Correct Patient, Correct Site, Correct Laterality,  Correct Procedure, Correct Position, site marked,  Risks and benefits discussed,  Pre-op evaluation,  At surgeon's request and post-op pain management  Laterality: Right  Prep: Maximum Sterile Barrier Precautions used, chloraprep       Needles:  Injection technique: Single-shot  Needle Type: Echogenic Stimulator Needle     Needle Length: 9cm  Needle Gauge: 22     Additional Needles:   Procedures:,,,, ultrasound used (permanent image in chart),,    Narrative:  Start time: 10/21/2021 7:05 AM End time: 10/21/2021 7:08 AM Injection made incrementally with aspirations every 5 mL.  Performed by: Personally  Anesthesiologist: Brennan Bailey, MD  Additional Notes: Risks, benefits, and alternative discussed. Patient gave consent for procedure. Patient prepped and draped in sterile fashion. Sedation administered, patient remains easily responsive to voice. Relevant anatomy identified with ultrasound guidance. Local anesthetic given in 5cc increments with no signs or symptoms of intravascular injection. No pain or paraesthesias with injection. Patient monitored throughout procedure with signs of LAST or immediate complications. Tolerated well. Ultrasound image placed in chart.  Tawny Asal, MD

## 2021-10-21 NOTE — Anesthesia Procedure Notes (Signed)
Anesthesia Regional Block: Adductor canal block   Pre-Anesthetic Checklist: , timeout performed,  Correct Patient, Correct Site, Correct Laterality,  Correct Procedure, Correct Position, site marked,  Risks and benefits discussed,  Pre-op evaluation,  At surgeon's request and post-op pain management  Laterality: Right  Prep: Maximum Sterile Barrier Precautions used, chloraprep       Needles:  Injection technique: Single-shot  Needle Type: Echogenic Stimulator Needle     Needle Length: 9cm  Needle Gauge: 22     Additional Needles:   Procedures:,,,, ultrasound used (permanent image in chart),,    Narrative:  Start time: 10/21/2021 7:02 AM End time: 10/21/2021 7:05 AM Injection made incrementally with aspirations every 5 mL.  Performed by: Personally  Anesthesiologist: Brennan Bailey, MD  Additional Notes: Risks, benefits, and alternative discussed. Patient gave consent for procedure. Patient prepped and draped in sterile fashion. Sedation administered, patient remains easily responsive to voice. Relevant anatomy identified with ultrasound guidance. Local anesthetic given in 5cc increments with no signs or symptoms of intravascular injection. No pain or paraesthesias with injection. Patient monitored throughout procedure with signs of LAST or immediate complications. Tolerated well. Ultrasound image placed in chart.  Tawny Asal, MD

## 2021-10-21 NOTE — Anesthesia Procedure Notes (Signed)
Procedure Name: LMA Insertion Date/Time: 10/21/2021 7:35 AM  Performed by: Geremy Rister, Ernesta Amble, CRNAPre-anesthesia Checklist: Patient identified, Emergency Drugs available, Suction available and Patient being monitored Patient Re-evaluated:Patient Re-evaluated prior to induction Oxygen Delivery Method: Circle system utilized Preoxygenation: Pre-oxygenation with 100% oxygen Induction Type: IV induction Ventilation: Mask ventilation without difficulty LMA: LMA inserted LMA Size: 4.0 Number of attempts: 1 Airway Equipment and Method: Bite block Placement Confirmation: positive ETCO2 Tube secured with: Tape Dental Injury: Teeth and Oropharynx as per pre-operative assessment

## 2021-10-21 NOTE — H&P (Signed)
Marie Morales is an 46 y.o. female.   Chief Complaint: right ankle pain HPI: 46 y/o female with a long history of right ankle and hindfoot pain due to posterior tibial tendonitis and progressive collapsing foot deformity.  She has failed non op treatment including bracing, activity modification and shoewear modification and physical therapy.  She presents now for surgical treatment of this painful condition.  Past Medical History:  Diagnosis Date   Arthritis    Asthma    NO INHALERS   Chronic abdominal pain    Chronic headache    Edema extremities    Incompetent cervix    LLQ pain 08/22/2012   Menorrhagia    Myalgia 09/10/2012   RLQ abdominal pain 09/10/2012   RUQ pain 08/22/2012   Trichimoniasis 08/22/2012   Vaginal discharge 02/26/2013    Past Surgical History:  Procedure Laterality Date   ABDOMINAL HYSTERECTOMY     BREAST BIOPSY Right 06/19/2019   Affirm bx-"X" clip-neg   CYSTOSCOPY N/A 11/13/2014   Procedure: CYSTOSCOPY;  Surgeon: Malachy Mood, MD;  Location: [facility] ORS;  Service: Gynecology;  Laterality: N/A;   DILATION AND CURETTAGE OF UTERUS     DORSAL COMPARTMENT RELEASE Right 09/06/2018   Procedure: RELEASE DORSAL COMPARTMENT (DEQUERVAIN);  Surgeon: Hessie Knows, MD;  Location: [facility] ORS;  Service: Orthopedics;  Laterality: Right;   ENDOMETRIAL ABLATION     endometrial ablation   FOOT SURGERY     KNEE ARTHROSCOPY     KNEE SURGERY Right    lap    LAPAROSCOPIC HYSTERECTOMY Bilateral 11/13/2014   Procedure: HYSTERECTOMY TOTAL LAPAROSCOPIC,bilateral salpingectomy;  Surgeon: Malachy Mood, MD;  Location: [facility] ORS;  Service: Gynecology;  Laterality: Bilateral;   OVARIAN CYST REMOVAL     OVARIAN CYST SURGERY     TONSILLECTOMY     TUBAL LIGATION      Family History  Problem Relation Age of Onset   Hypertension Mother    Breast cancer Mother 10   Cancer Maternal Grandmother        breast and lung   Diabetes Maternal Grandmother    Breast cancer Maternal  Grandmother    Diabetes Paternal Grandmother    Diabetes Paternal Grandfather    Social History:  reports that she has never smoked. She has never used smokeless tobacco. She reports current alcohol use. She reports that she does not use drugs.  Allergies:  Allergies  Allergen Reactions   Levofloxacin Other (See Comments)   Shellfish Allergy Swelling    THROAT SWELLS    Medications Prior to Admission  Medication Sig Dispense Refill   albuterol (VENTOLIN HFA) 108 (90 Base) MCG/ACT inhaler Inhale 2 puffs into the lungs every 6 (six) hours as needed for wheezing or shortness of breath.     atorvastatin (LIPITOR) 20 MG tablet Take 1 tablet by mouth daily.     BIOTIN PO Take 1 tablet by mouth once a week.     budesonide-formoterol (SYMBICORT) 80-4.5 MCG/ACT inhaler Inhale into the lungs.     clobetasol cream (TEMOVATE) 0.05 % clobetasol 0.05 % topical cream     fexofenadine (ALLEGRA) 180 MG tablet fexofenadine 180 mg tablet     Fluocinolone Acetonide Body 0.01 % OIL fluocinolone 0.01 % topical body oil  APPLY TOPICALLY UP TO TWICE DAILY AS NEEDED FOR ITCHY SPOTS ON SCALP.     gabapentin (NEURONTIN) 100 MG capsule Take 2 capsules (200 mg total) by mouth at bedtime for 15 days, THEN 3 capsules (300 mg total) at bedtime. Elmo  capsule 0   ibuprofen (ADVIL) 800 MG tablet Take 1 tablet (800 mg total) by mouth every 6 (six) hours as needed for moderate pain. 20 tablet 0   meloxicam (MOBIC) 7.5 MG tablet 1 tablet     methocarbamol (ROBAXIN) 500 MG tablet Take 1 tablet (500 mg total) by mouth every 8 (eight) hours as needed for muscle spasms. 20 tablet 0   pantoprazole (PROTONIX) 20 MG tablet pantoprazole 20 mg tablet,delayed release     potassium chloride (K-DUR,KLOR-CON) 10 MEQ tablet Take 10 mEq by mouth daily.     predniSONE (DELTASONE) 5 MG tablet Take by mouth.     Semaglutide (RYBELSUS) 3 MG TABS Taking or wt loss     topiramate (TOPAMAX) 25 MG tablet Take 1 tablet by mouth 2 (two) times  daily.     triamcinolone cream (KENALOG) 0.1 % 1 application to affected area     venlafaxine XR (EFFEXOR-XR) 75 MG 24 hr capsule 1 capsule with food     Vitamin D, Ergocalciferol, (DRISDOL) 1.25 MG (50000 UNIT) CAPS capsule Take 1 capsule by mouth once a week.     Spacer/Aero-Holding [facility] DIAMOND) MISC Use as directed.     SUMAtriptan (IMITREX) 100 MG tablet 1 tablet as needed      No results found for this or any previous visit (from the past 48 hour(s)). DG MINI C-ARM IMAGE ONLY  Result Date: 16-Nov-2021 There is no interpretation for this exam.  This order is for images obtained during a surgical procedure.  Please See "Surgeries" Tab for more information regarding the procedure.    Review of Systems  no recent f/c/n/v/wt loss.  Blood pressure (!) 121/93, pulse 62, temperature (!) 97 F (36.1 C), temperature source Oral, resp. rate 16, height '6\' 1"'$  (1.854 m), weight (!) 153.2 kg, SpO2 100 %. Physical Exam  Wn wd woman in nad.  A and O x 4.  Normal mood and affect.  EOMI.  Resp unlabored.  R ankle TTP along the post tib tendon.  Skin healthy.  No lymphadenopathy.  5/5 strength in PF, DF and eversion.  Palpable pulses.  Intact sens to LT in the saph n dist.  Assessment/Plan R short achilles, post tib tendonitis (PCFD).  To the OR today for gastroc recession and flatfoot recon.  The risks and benefits of the alternative treatment options have been discussed in detail.  The patient wishes to proceed with surgery and specifically understands risks of bleeding, infection, nerve damage, blood clots, need for additional surgery, amputation and death.   Wylene Simmer, MD 11/16/2021, 7:25 AM

## 2021-10-21 NOTE — Transfer of Care (Signed)
Immediate Anesthesia Transfer of Care Note  Patient: Marie Morales  Procedure(s) Performed: GASTROCNEMIUS RECESSION (Right: Leg Lower) CALCANEAL OSTEOTOMY (Right: Foot) FLEXOR DIGITORUM LONGUS TENDON TO NAVICULAR TRANSFER, POSTERIOR TIBIAL TENDON TENOLYSIS AND DELTOID LIGAMENT REPAIR (Right: Ankle)  Patient Location: PACU  Anesthesia Type:GA combined with regional for post-op pain  Level of Consciousness: drowsy and patient cooperative  Airway & Oxygen Therapy: Patient Spontanous Breathing and Patient connected to face mask oxygen  Post-op Assessment: Report given to RN and Post -op Vital signs reviewed and stable  Post vital signs: Reviewed and stable  Last Vitals:  Vitals Value Taken Time  BP 143/64 10/21/21 0908  Temp    Pulse 81 10/21/21 0910  Resp    SpO2 97 % 10/21/21 0910  Vitals shown include unvalidated device data.  Last Pain:  Vitals:   10/21/21 0641  TempSrc: Oral  PainSc: 9       Patients Stated Pain Goal: 8 (66/06/30 1601)  Complications: No notable events documented.

## 2021-10-22 ENCOUNTER — Encounter (HOSPITAL_BASED_OUTPATIENT_CLINIC_OR_DEPARTMENT_OTHER): Payer: Self-pay | Admitting: Orthopedic Surgery

## 2021-10-22 NOTE — Progress Notes (Signed)
Left message stating courtesy call and if any questions or concerns please call the doctors office.  

## 2022-02-23 IMAGING — US US EXTREM LOW VENOUS*R*
1 series · 13 of 24 positions shown · non-contrast
Comparison: Ultrasound dated 05/12/2009

CLINICAL DATA: 43-year-old female with right lower extremity
swelling and pain.

EXAM:
Right LOWER EXTREMITY VENOUS DOPPLER ULTRASOUND
TECHNIQUE: Gray-scale sonography with compression, as well as color and duplex
ultrasound, were performed to evaluate the deep venous system(s)
from the level of the common femoral vein through the popliteal and
proximal calf veins.

[Series 1: us extrem low venous*right* · 13 of 36 slices shown]
[im 1/36]
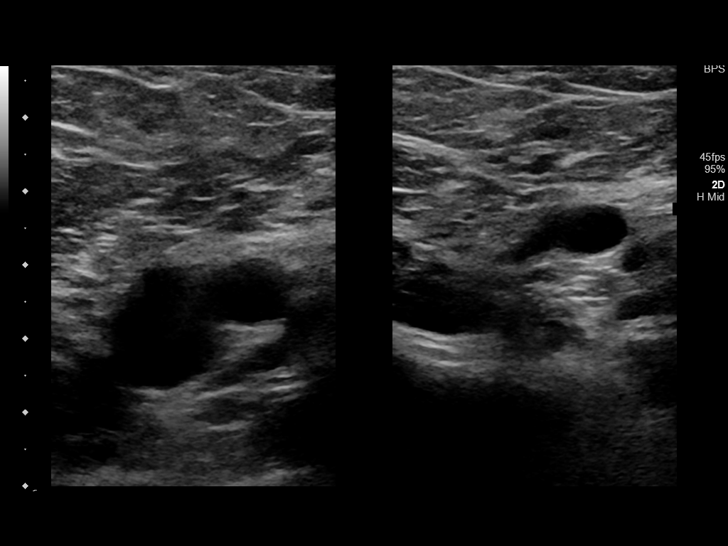
[im 4/36]
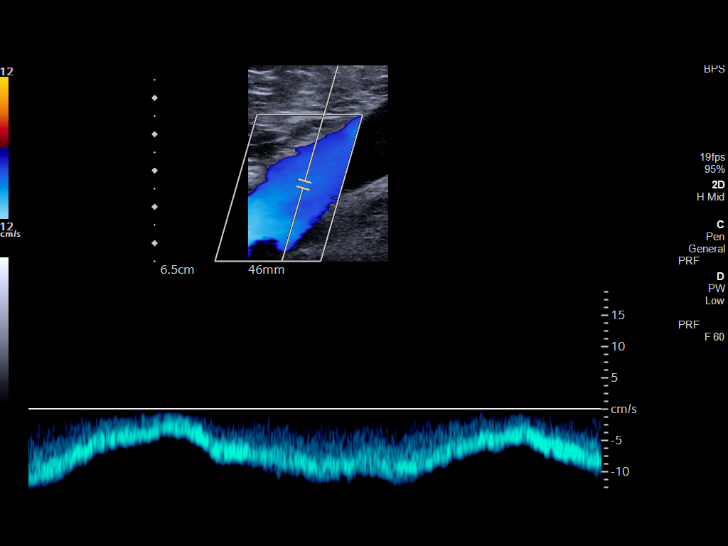
[im 7/36]
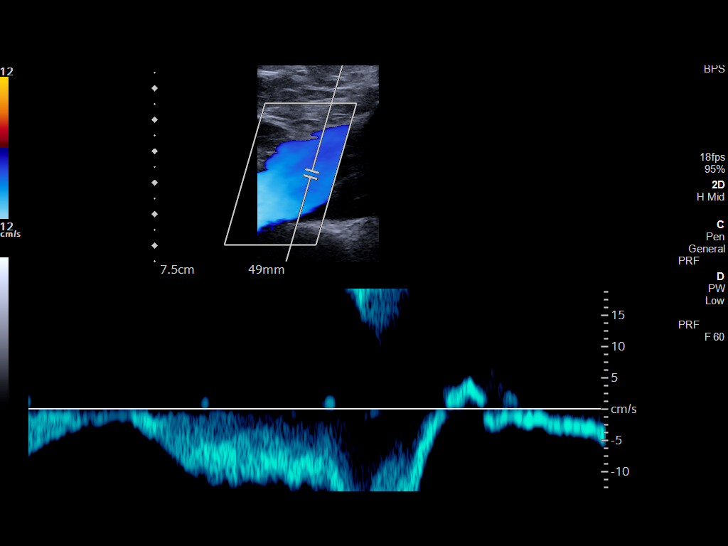
[im 10/36]
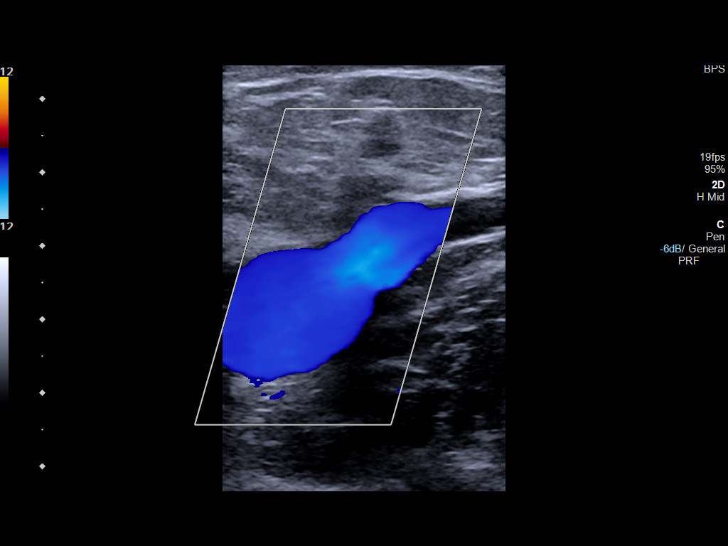
[im 13/36]
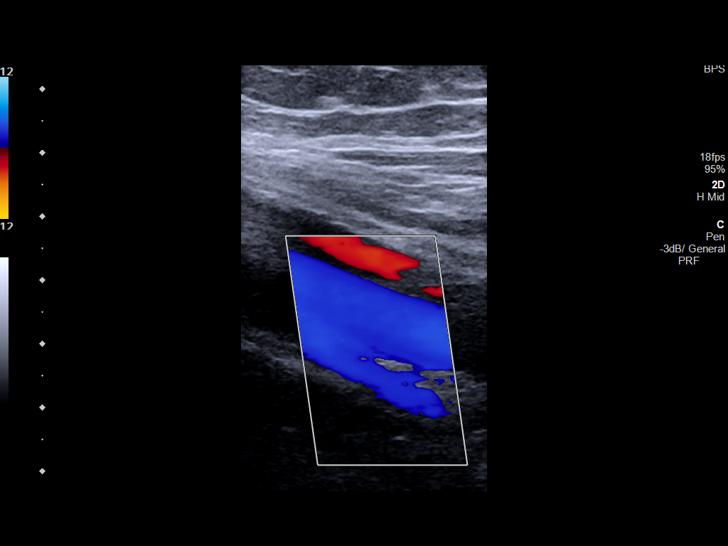
[im 16/36]
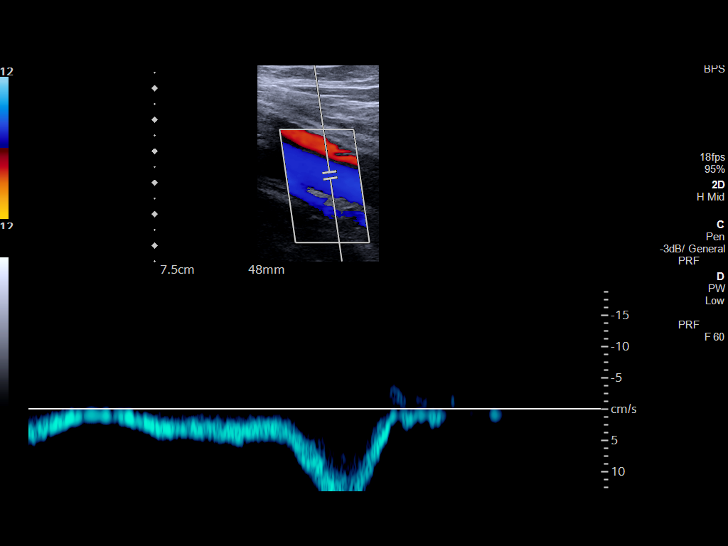
[im 19/36]
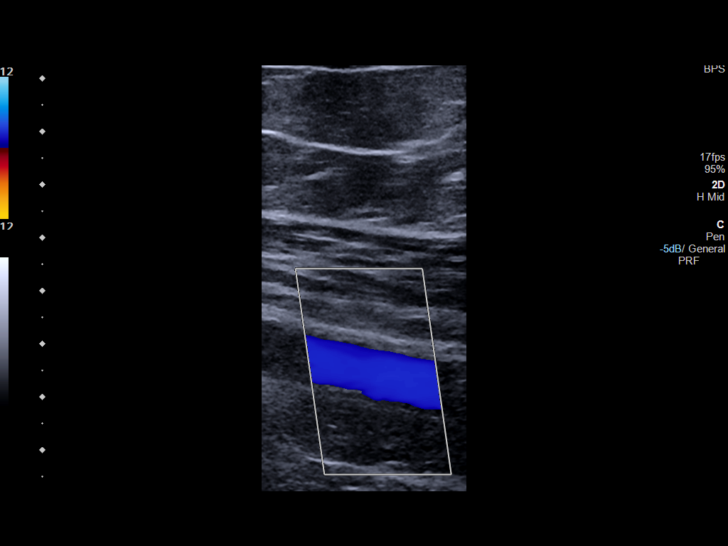
[im 20/36]
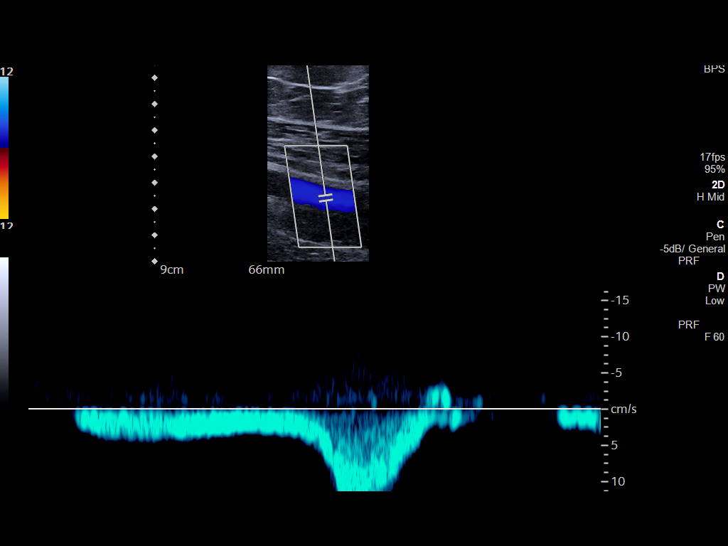
[im 23/36]
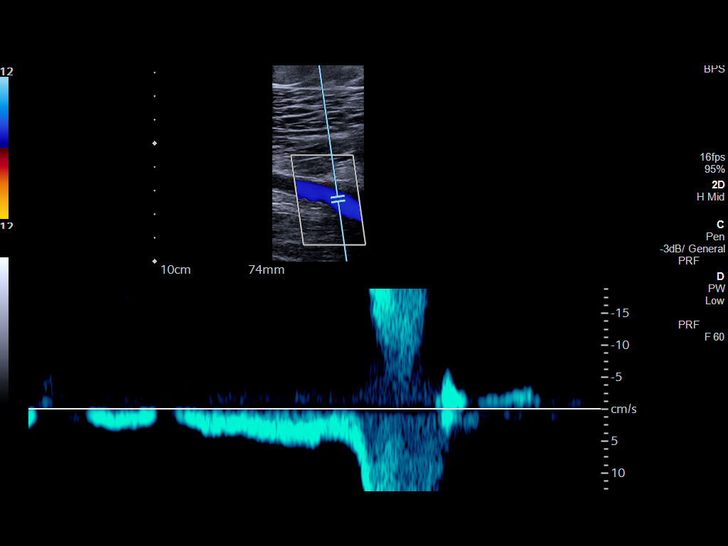
[im 26/36]
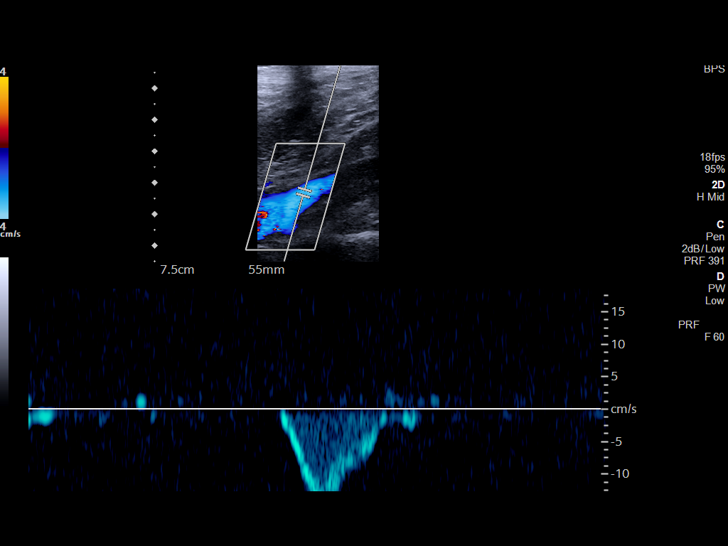
[im 29/36]
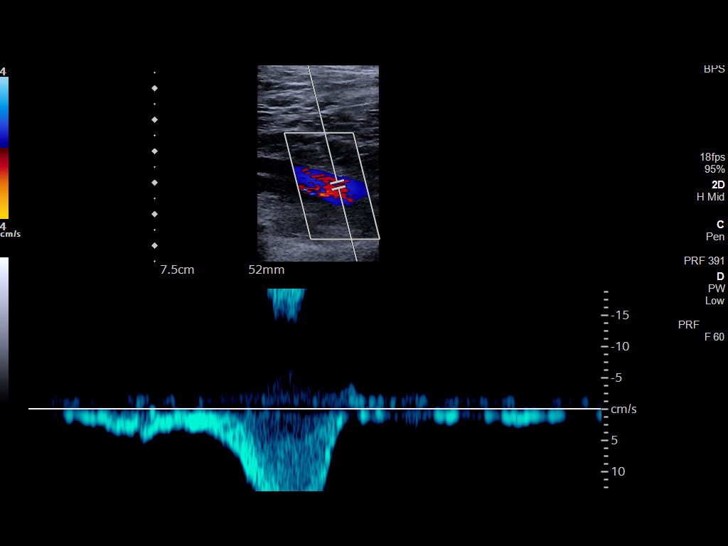
[im 32/36]
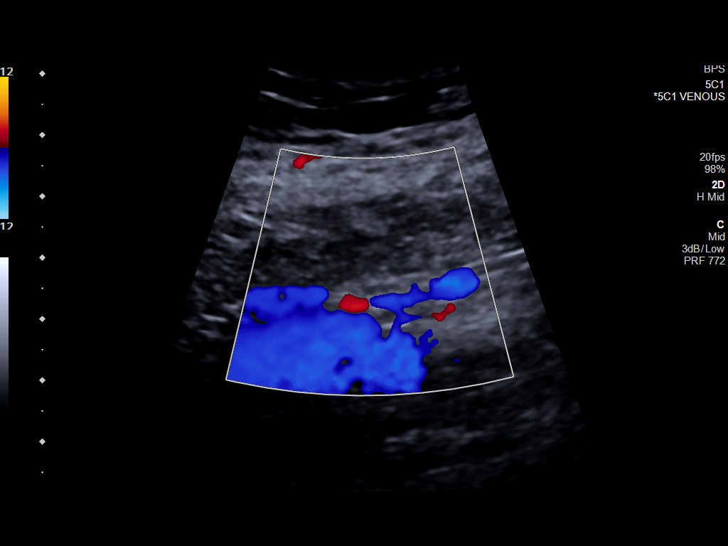
[im 36/36]
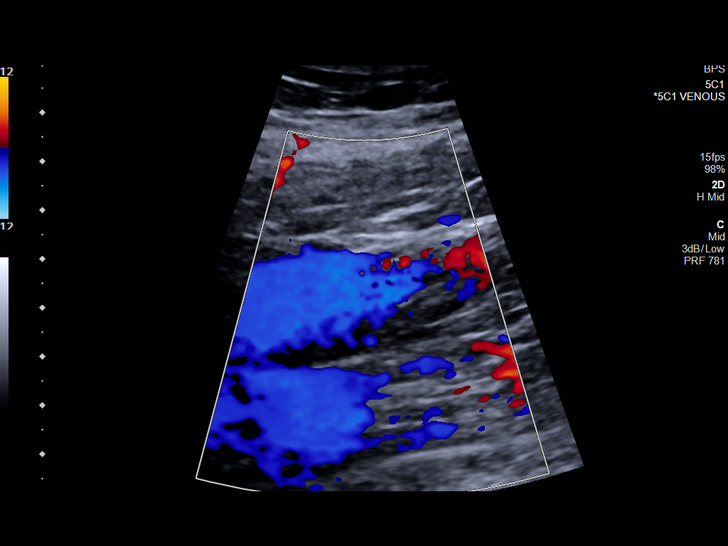

[13 of 24 positions shown; findings below may reference images not displayed]

FINDINGS: VENOUS

Normal compressibility of the common femoral, superficial femoral,
and popliteal veins, as well as the visualized calf veins.
Visualized portions of profunda femoral vein and great saphenous
vein unremarkable. No filling defects to suggest DVT on grayscale or
color Doppler imaging. Doppler waveforms show normal direction of
venous flow, normal respiratory phasicity and response to
augmentation.

Limited views of the contralateral common femoral vein are
unremarkable.

OTHER

None.

Limitations: none
IMPRESSION: No femoropopliteal DVT nor evidence of DVT within the visualized
calf veins.

If clinical symptoms are inconsistent or if there are persistent or
worsening symptoms, further imaging (possibly involving the iliac
veins) may be warranted.

## 2022-04-07 ENCOUNTER — Encounter: Payer: Self-pay | Admitting: Radiology

## 2022-04-22 ENCOUNTER — Encounter: Payer: Self-pay | Admitting: Gastroenterology

## 2022-04-24 NOTE — H&P (Signed)
Pre-Procedure H&P   Patient ID: Marie Morales is a 47 y.o. female.  Gastroenterology Provider: Annamaria Helling, DO  Referring Provider: Octavia Bruckner, PA PCP: Renee Rival, NP  Date: 04/25/2022  HPI Marie Morales is a 47 y.o. female who presents today for Esophagogastroduodenoscopy and Colonoscopy for GERD, screening colonoscopy .  Patient undergoing initial screening colonoscopy.  She reports no family history of colon cancer or colon polyps.  She deals with chronic constipation and sometimes having only 1 bowel movement per week.  Increased abdominal bloating with said constipation.  No melena or hematochezia.  Patient has had worsening GERD symptoms despite PPI use.  No nocturnal reflux.  Notably she has gained a significant amount of weight going from 271 to 350 pounds.  She underwent EGD at [facility] in March 2022 for dysphagia and reflux.  She was dilated with a savory 19 mm over guidewire with no change on reinspection.  Biopsies were negative for EOE and H. pylori.  Z-line was regular with GEJ at the 40 cm mark and no hiatal hernia.  Status post hysterectomy  Most recent lab work hemoglobin 11.6 MCV 87.6 platelets 289,000 creatinine 0.7   Past Medical History:  Diagnosis Date   Arthritis    Asthma    NO INHALERS   Chronic abdominal pain    Chronic headache    Edema extremities    Incompetent cervix    LLQ pain 08/22/2012   Menorrhagia    Myalgia 09/10/2012   RLQ abdominal pain 09/10/2012   RUQ pain 08/22/2012   Trichimoniasis 08/22/2012   Vaginal discharge 02/26/2013    Past Surgical History:  Procedure Laterality Date   ABDOMINAL HYSTERECTOMY     BREAST BIOPSY Right 06/19/2019   Affirm bx-"X" clip-neg   CALCANEAL OSTEOTOMY Right 10/21/2021   Procedure: CALCANEAL OSTEOTOMY;  Surgeon: Wylene Simmer, MD;  Location: [place];  Service: Orthopedics;  Laterality: Right;   CYSTOSCOPY N/A 11/13/2014   Procedure: CYSTOSCOPY;   Surgeon: Malachy Mood, MD;  Location: [facility];  Service: Gynecology;  Laterality: N/A;   DILATION AND CURETTAGE OF UTERUS     DORSAL COMPARTMENT RELEASE Right 09/06/2018   Procedure: RELEASE DORSAL COMPARTMENT (DEQUERVAIN);  Surgeon: Hessie Knows, MD;  Location: [facility];  Service: Orthopedics;  Laterality: Right;   ENDOMETRIAL ABLATION     endometrial ablation   FOOT SURGERY     GASTROCNEMIUS RECESSION Right 10/21/2021   Procedure: GASTROCNEMIUS RECESSION;  Surgeon: Wylene Simmer, MD;  Location: [place];  Service: Orthopedics;  Laterality: Right;   KNEE ARTHROSCOPY     KNEE SURGERY Right    lap    LAPAROSCOPIC HYSTERECTOMY Bilateral 11/13/2014   Procedure: HYSTERECTOMY TOTAL LAPAROSCOPIC,bilateral salpingectomy;  Surgeon: Malachy Mood, MD;  Location: [facility];  Service: Gynecology;  Laterality: Bilateral;   OVARIAN CYST REMOVAL     OVARIAN CYST SURGERY     TONSILLECTOMY     TUBAL LIGATION      Family History No h/o GI disease or malignancy  Review of Systems  Constitutional:  Negative for activity change, appetite change, chills, diaphoresis, fatigue, fever and unexpected weight change.  HENT:  Negative for trouble swallowing and voice change.   Respiratory:  Negative for shortness of breath and wheezing.   Cardiovascular:  Negative for chest pain, palpitations and leg swelling.  Gastrointestinal:  Positive for constipation. Negative for abdominal distention, abdominal pain, anal bleeding, blood in stool, diarrhea, nausea, rectal pain and vomiting.       +  gerd  Musculoskeletal:  Negative for arthralgias and myalgias.  Skin:  Negative for color change and pallor.  Neurological:  Negative for dizziness, syncope and weakness.  Psychiatric/Behavioral:  Negative for confusion.   All other systems reviewed and are negative.    Medications No current facility-administered medications on file prior to encounter.   Current Outpatient Medications on File Prior  to Encounter  Medication Sig Dispense Refill   albuterol (VENTOLIN HFA) 108 (90 Base) MCG/ACT inhaler Inhale 2 puffs into the lungs every 6 (six) hours as needed for wheezing or shortness of breath.     atorvastatin (LIPITOR) 20 MG tablet Take 1 tablet by mouth daily.     budesonide-formoterol (SYMBICORT) 80-4.5 MCG/ACT inhaler Inhale into the lungs.     Fluocinolone Acetonide Body 0.01 % OIL fluocinolone 0.01 % topical body oil  APPLY TOPICALLY UP TO TWICE DAILY AS NEEDED FOR ITCHY SPOTS ON SCALP.     pantoprazole (PROTONIX) 20 MG tablet pantoprazole 20 mg tablet,delayed release     topiramate (TOPAMAX) 25 MG tablet Take 1 tablet by mouth 2 (two) times daily.     venlafaxine XR (EFFEXOR-XR) 75 MG 24 hr capsule 150 mg daily with breakfast.     Vitamin D, Ergocalciferol, (DRISDOL) 1.25 MG (50000 UNIT) CAPS capsule Take 1 capsule by mouth once a week.     BIOTIN PO Take 1 tablet by mouth once a week. (Patient not taking: Reported on 04/25/2022)     clobetasol cream (TEMOVATE) 0.05 % clobetasol 0.05 % topical cream     docusate sodium (COLACE) 100 MG capsule Take 1 capsule (100 mg total) by mouth 2 (two) times daily. While taking narcotic pain medicine. 30 capsule 0   fexofenadine (ALLEGRA) 180 MG tablet fexofenadine 180 mg tablet     gabapentin (NEURONTIN) 100 MG capsule Take 2 capsules (200 mg total) by mouth at bedtime for 15 days, THEN 3 capsules (300 mg total) at bedtime. 120 capsule 0   ibuprofen (ADVIL) 800 MG tablet Take 1 tablet (800 mg total) by mouth every 6 (six) hours as needed for moderate pain. 20 tablet 0   meloxicam (MOBIC) 7.5 MG tablet 1 tablet     methocarbamol (ROBAXIN) 500 MG tablet Take 1 tablet (500 mg total) by mouth every 8 (eight) hours as needed for muscle spasms. 20 tablet 0   potassium chloride (K-DUR,KLOR-CON) 10 MEQ tablet Take 10 mEq by mouth daily. (Patient not taking: Reported on 04/25/2022)     predniSONE (DELTASONE) 5 MG tablet Take by mouth.     rivaroxaban  (XARELTO) 10 MG TABS tablet Take 1 tablet (10 mg total) by mouth daily. (Patient not taking: Reported on 04/25/2022) 14 tablet 0   Semaglutide (RYBELSUS) 3 MG TABS Taking or wt loss (Patient not taking: Reported on 04/25/2022)     senna (SENOKOT) 8.6 MG TABS tablet Take 2 tablets (17.2 mg total) by mouth 2 (two) times daily. 30 tablet 0   Spacer/Aero-Holding Chambers (OPTICHAMBER DIAMOND) MISC Use as directed.     SUMAtriptan (IMITREX) 100 MG tablet 1 tablet as needed     triamcinolone cream (KENALOG) 0.1 % 1 application to affected area      Pertinent medications related to GI and procedure were reviewed by me with the patient prior to the procedure   Current Facility-Administered Medications:    0.9 %  sodium chloride infusion, , Intravenous, Continuous, Annamaria Helling, DO      Allergies  Allergen Reactions   Levofloxacin Other (See Comments)  Shellfish Allergy Swelling    THROAT SWELLS   Allergies were reviewed by me prior to the procedure  Objective   Body mass index is 47.22 kg/m. Vitals:   04/25/22 0711  BP: 120/70  Pulse: 67  Resp: 16  Temp: (!) 96.3 F (35.7 C)  TempSrc: Temporal  SpO2: 100%  Weight: (!) 157.9 kg  Height: 6' (1.829 m)     Physical Exam Vitals and nursing note reviewed.  Constitutional:      General: She is not in acute distress.    Appearance: Normal appearance. She is obese. She is not ill-appearing, toxic-appearing or diaphoretic.  HENT:     Head: Normocephalic and atraumatic.     Nose: Nose normal.     Mouth/Throat:     Mouth: Mucous membranes are moist.     Pharynx: Oropharynx is clear.  Eyes:     General: No scleral icterus.    Extraocular Movements: Extraocular movements intact.  Cardiovascular:     Rate and Rhythm: Normal rate and regular rhythm.     Heart sounds: Normal heart sounds. No murmur heard.    No friction rub. No gallop.  Pulmonary:     Effort: Pulmonary effort is normal. No respiratory distress.     Breath  sounds: Normal breath sounds. No wheezing, rhonchi or rales.  Abdominal:     General: Bowel sounds are normal. There is no distension.     Palpations: Abdomen is soft.     Tenderness: There is no abdominal tenderness. There is no guarding or rebound.  Musculoskeletal:     Cervical back: Neck supple.     Right lower leg: No edema.     Left lower leg: No edema.  Skin:    General: Skin is warm and dry.     Coloration: Skin is not jaundiced or pale.  Neurological:     General: No focal deficit present.     Mental Status: She is alert and oriented to person, place, and time. Mental status is at baseline.  Psychiatric:        Mood and Affect: Mood normal.        Behavior: Behavior normal.        Thought Content: Thought content normal.        Judgment: Judgment normal.      Assessment:  Ms. QUANNA SCHOLLMEYER is a 47 y.o. female  who presents today for Esophagogastroduodenoscopy and Colonoscopy for GERD, screening colonoscopy .  Plan:  Esophagogastroduodenoscopy and Colonoscopy with possible intervention today  Esophagogastroduodenoscopy and Colonoscopy with possible biopsy, control of bleeding, polypectomy, and interventions as necessary has been discussed with the patient/patient representative. Informed consent was obtained from the patient/patient representative after explaining the indication, nature, and risks of the procedure including but not limited to death, bleeding, perforation, missed neoplasm/lesions, cardiorespiratory compromise, and reaction to medications. Opportunity for questions was given and appropriate answers were provided. Patient/patient representative has verbalized understanding is amenable to undergoing the procedure.   Annamaria Helling, DO  [facility]  Portions of the record may have been created with voice recognition software. Occasional wrong-word or 'sound-a-like' substitutions may have occurred due to the inherent limitations  of voice recognition software.  Read the chart carefully and recognize, using context, where substitutions may have occurred.

## 2022-04-25 ENCOUNTER — Encounter: Payer: Self-pay | Admitting: Gastroenterology

## 2022-04-25 ENCOUNTER — Other Ambulatory Visit: Payer: Self-pay

## 2022-04-25 ENCOUNTER — Encounter
Admission: RE | Disposition: A | Discharge status: Home / Self Care | Payer: Self-pay | Source: Ambulatory Visit | Attending: Gastroenterology

## 2022-04-25 ENCOUNTER — Ambulatory Visit: Payer: BC Managed Care – PPO | Admitting: Anesthesiology

## 2022-04-25 ENCOUNTER — Ambulatory Visit
Admission: RE | Admit: 2022-04-25 | Discharge: 2022-04-25 | Disposition: A | Discharge status: Home / Self Care | Payer: BC Managed Care – PPO | Source: Ambulatory Visit | Attending: Gastroenterology | Admitting: Gastroenterology

## 2022-04-25 DIAGNOSIS — Z1211 Encounter for screening for malignant neoplasm of colon: Secondary | ICD-10-CM | POA: Diagnosis not present

## 2022-04-25 DIAGNOSIS — K449 Diaphragmatic hernia without obstruction or gangrene: Secondary | ICD-10-CM | POA: Diagnosis not present

## 2022-04-25 DIAGNOSIS — K640 First degree hemorrhoids: Secondary | ICD-10-CM | POA: Diagnosis not present

## 2022-04-25 DIAGNOSIS — J45909 Unspecified asthma, uncomplicated: Secondary | ICD-10-CM | POA: Insufficient documentation

## 2022-04-25 DIAGNOSIS — R519 Headache, unspecified: Secondary | ICD-10-CM | POA: Insufficient documentation

## 2022-04-25 DIAGNOSIS — Z6841 Body Mass Index (BMI) 40.0 and over, adult: Secondary | ICD-10-CM | POA: Diagnosis not present

## 2022-04-25 DIAGNOSIS — M1990 Unspecified osteoarthritis, unspecified site: Secondary | ICD-10-CM | POA: Insufficient documentation

## 2022-04-25 DIAGNOSIS — K2950 Unspecified chronic gastritis without bleeding: Secondary | ICD-10-CM | POA: Diagnosis not present

## 2022-04-25 DIAGNOSIS — K219 Gastro-esophageal reflux disease without esophagitis: Secondary | ICD-10-CM | POA: Insufficient documentation

## 2022-04-25 DIAGNOSIS — K2100 Gastro-esophageal reflux disease with esophagitis, without bleeding: Secondary | ICD-10-CM | POA: Diagnosis not present

## 2022-04-25 HISTORY — PX: COLONOSCOPY: SHX5424

## 2022-04-25 HISTORY — PX: ESOPHAGOGASTRODUODENOSCOPY: SHX5428

## 2022-04-25 SURGERY — COLONOSCOPY
Anesthesia: General

## 2022-04-25 MED ORDER — ONDANSETRON HCL 4 MG/2ML IJ SOLN
INTRAMUSCULAR | Status: AC
  Filled 2022-04-25: qty 2

## 2022-04-25 MED ORDER — PROPOFOL 10 MG/ML IV BOLUS
INTRAVENOUS | Status: AC
  Filled 2022-04-25: qty 20

## 2022-04-25 MED ORDER — GLYCOPYRROLATE 0.2 MG/ML IJ SOLN
INTRAMUSCULAR | Status: DC | PRN
  Administered 2022-04-25: .2 mg via INTRAVENOUS

## 2022-04-25 MED ORDER — LIDOCAINE HCL (CARDIAC) PF 100 MG/5ML IV SOSY
PREFILLED_SYRINGE | INTRAVENOUS | Status: DC | PRN
  Administered 2022-04-25: 100 mg via INTRAVENOUS

## 2022-04-25 MED ORDER — ONDANSETRON HCL 4 MG/2ML IJ SOLN
INTRAMUSCULAR | Status: DC | PRN
  Administered 2022-04-25: 4 mg via INTRAVENOUS

## 2022-04-25 MED ORDER — LIDOCAINE HCL (PF) 2 % IJ SOLN
INTRAMUSCULAR | Status: AC
  Filled 2022-04-25: qty 5

## 2022-04-25 MED ORDER — SODIUM CHLORIDE 0.9 % IV SOLN: INTRAVENOUS | Status: DC

## 2022-04-25 MED ORDER — PROPOFOL 10 MG/ML IV BOLUS
INTRAVENOUS | Status: AC
  Filled 2022-04-25: qty 40

## 2022-04-25 MED ORDER — GLYCOPYRROLATE 0.2 MG/ML IJ SOLN
INTRAMUSCULAR | Status: AC
  Filled 2022-04-25: qty 1

## 2022-04-25 MED ORDER — PROPOFOL 10 MG/ML IV BOLUS
INTRAVENOUS | Status: DC | PRN
  Administered 2022-04-25: 40 mg via INTRAVENOUS
  Administered 2022-04-25 (×2): 60 mg via INTRAVENOUS
  Administered 2022-04-25: 30 mg via INTRAVENOUS
  Administered 2022-04-25: 160 mg via INTRAVENOUS

## 2022-04-25 MED ORDER — PROPOFOL 500 MG/50ML IV EMUL
INTRAVENOUS | Status: DC | PRN
  Administered 2022-04-25: 150 ug/kg/min via INTRAVENOUS

## 2022-04-25 NOTE — Anesthesia Postprocedure Evaluation (Signed)
Anesthesia Post Note  Patient: Marie Morales  Procedure(s) Performed: COLONOSCOPY ESOPHAGOGASTRODUODENOSCOPY (EGD)  Patient location during evaluation: Endoscopy Anesthesia Type: General Level of consciousness: awake and alert Pain management: pain level controlled Vital Signs Assessment: post-procedure vital signs reviewed and stable Respiratory status: spontaneous breathing, nonlabored ventilation, respiratory function stable and patient connected to nasal cannula oxygen Cardiovascular status: blood pressure returned to baseline and stable Postop Assessment: no apparent nausea or vomiting Anesthetic complications: no   No notable events documented.   Last Vitals:  Vitals:   04/25/22 0816 04/25/22 0826  BP:  126/70  Pulse:    Resp:    Temp: (!) 35.8 C   SpO2:      Last Pain:  Vitals:   04/25/22 0826  TempSrc:   PainSc: 0-No pain                 Ilene Qua

## 2022-04-25 NOTE — Interval H&P Note (Signed)
History and Physical Interval Note: Preprocedure H&P from 04/25/22  was reviewed and there was no interval change after seeing and examining the patient.  Written consent was obtained from the patient after discussion of risks, benefits, and alternatives. Patient has consented to proceed with Esophagogastroduodenoscopy and Colonoscopy with possible intervention   04/25/2022 7:30 AM  Marie Morales  has presented today for surgery, with the diagnosis of Colon cancer screening (Z12.11) Gastroesophageal reflux disease, unspecified whether esophagitis present (K21.9) NSAID long-term use (Z79.1).  The various methods of treatment have been discussed with the patient and family. After consideration of risks, benefits and other options for treatment, the patient has consented to  Procedure(s): COLONOSCOPY (N/A) ESOPHAGOGASTRODUODENOSCOPY (EGD) (N/A) as a surgical intervention.  The patient's history has been reviewed, patient examined, no change in status, stable for surgery.  I have reviewed the patient's chart and labs.  Questions were answered to the patient's satisfaction.     Annamaria Helling

## 2022-04-25 NOTE — Transfer of Care (Signed)
Immediate Anesthesia Transfer of Care Note  Patient: [name]  Procedure(s) Performed: COLONOSCOPY ESOPHAGOGASTRODUODENOSCOPY (EGD)  Patient Location: PACU  Anesthesia Type:General  Level of Consciousness: drowsy  Airway & Oxygen Therapy: Patient Spontanous Breathing and Patient connected to nasal cannula oxygen  Post-op Assessment: Report given to RN and Post -op Vital signs reviewed and stable  Post vital signs: Reviewed and stable  Last Vitals:  Vitals Value Taken Time  BP 123/73 04/25/22 0816  Temp 97.0   Pulse 92 04/25/22 0817  Resp 17 04/25/22 0817  SpO2 100 % 04/25/22 0817  Vitals shown include unvalidated device data.  Last Pain:  Vitals:   04/25/22 0816  TempSrc: (P) Temporal  PainSc:          Complications: No notable events documented.

## 2022-04-25 NOTE — Anesthesia Preprocedure Evaluation (Addendum)
Anesthesia Evaluation  Patient identified by MRN, date of birth, ID band Patient awake    Reviewed: Allergy & Precautions, NPO status , Patient's Chart, lab work & pertinent test results  History of Anesthesia Complications Negative for: history of anesthetic complications  Airway Mallampati: III  TM Distance: >3 FB Neck ROM: full    Dental no notable dental hx.    Pulmonary asthma    Pulmonary exam normal        Cardiovascular negative cardio ROS Normal cardiovascular exam     Neuro/Psych negative neurological ROS  negative psych ROS   GI/Hepatic Neg liver ROS,GERD  Medicated,,  Endo/Other    Morbid obesity  Renal/GU negative Renal ROS  negative genitourinary   Musculoskeletal  (+) Arthritis ,    Abdominal   Peds  Hematology negative hematology ROS (+)   Anesthesia Other Findings Past Medical History: No date: Arthritis No date: Asthma     Comment:  NO INHALERS No date: Chronic abdominal pain No date: Chronic headache No date: Edema extremities No date: Incompetent cervix 08/22/2012: LLQ pain No date: Menorrhagia 09/10/2012: Myalgia 09/10/2012: RLQ abdominal pain 08/22/2012: RUQ pain 08/22/2012: Trichimoniasis 02/26/2013: Vaginal discharge  Past Surgical History: No date: ABDOMINAL HYSTERECTOMY 06/19/2019: BREAST BIOPSY; Right     Comment:  Affirm bx-"X" clip-neg 10/21/2021: CALCANEAL OSTEOTOMY; Right     Comment:  Procedure: CALCANEAL OSTEOTOMY;  Surgeon: Wylene Simmer,               MD;  Location: [place];  Service:               Orthopedics;  Laterality: Right; 11/13/2014: CYSTOSCOPY; N/A     Comment:  Procedure: CYSTOSCOPY;  Surgeon: Malachy Mood, MD;                Location: [facility];  Service: Gynecology;  Laterality:               N/A; No date: DILATION AND CURETTAGE OF UTERUS 09/06/2018: DORSAL COMPARTMENT RELEASE; Right     Comment:  Procedure: RELEASE DORSAL  COMPARTMENT (DEQUERVAIN);                Surgeon: Hessie Knows, MD;  Location: [facility];                Service: Orthopedics;  Laterality: Right; No date: ENDOMETRIAL ABLATION     Comment:  endometrial ablation No date: FOOT SURGERY 10/21/2021: GASTROCNEMIUS RECESSION; Right     Comment:  Procedure: GASTROCNEMIUS RECESSION;  Surgeon: Wylene Simmer, MD;  Location: [place];  Service:              Orthopedics;  Laterality: Right; No date: KNEE ARTHROSCOPY No date: KNEE SURGERY; Right     Comment:  lap  11/13/2014: LAPAROSCOPIC HYSTERECTOMY; Bilateral     Comment:  Procedure: HYSTERECTOMY TOTAL LAPAROSCOPIC,bilateral               salpingectomy;  Surgeon: Malachy Mood, MD;  Location:              [facility];  Service: Gynecology;  Laterality: Bilateral; No date: OVARIAN CYST REMOVAL No date: OVARIAN CYST SURGERY No date: TONSILLECTOMY No date: TUBAL LIGATION     Reproductive/Obstetrics negative OB ROS  Anesthesia Physical Anesthesia Plan  ASA: 3  Anesthesia Plan: General   Post-op Pain Management: Minimal or no pain anticipated   Induction: Intravenous  PONV Risk Score and Plan: Propofol infusion and TIVA  Airway Management Planned: Natural Airway and Nasal Cannula  Additional Equipment:   Intra-op Plan:   Post-operative Plan:   Informed Consent: I have reviewed the patients History and Physical, chart, labs and discussed the procedure including the risks, benefits and alternatives for the proposed anesthesia with the patient or authorized representative who has indicated his/her understanding and acceptance.     Dental Advisory Given  Plan Discussed with: Anesthesiologist, CRNA and Surgeon  Anesthesia Plan Comments: (Patient consented for risks of anesthesia including but not limited to:  - adverse reactions to medications - risk of airway placement if required - damage to eyes,  teeth, lips or other oral mucosa - nerve damage due to positioning  - sore throat or hoarseness - Damage to heart, brain, nerves, lungs, other parts of body or loss of life  Patient voiced understanding.)        Anesthesia Quick Evaluation

## 2022-04-25 NOTE — Op Note (Signed)
[facility] Gastroenterology Patient Name: Marie Morales Procedure Date: 04/25/2022 7:28 AM MRN: [id number] Account #: [id number] Date of Birth: 1975/07/11 Admit Type: Outpatient Age: 47 Room: [facility] ENDO ROOM 2 Gender: Female Note Status: Finalized Instrument Name: Upper Endoscope [id number] Procedure:             Upper GI endoscopy Indications:           Suspected esophageal reflux Providers:             Annamaria Helling DO, DO Referring MD:          Renee Rival (Referring MD) Medicines:             Monitored Anesthesia Care Complications:         No immediate complications. Estimated blood loss:                         Minimal. Procedure:             Pre-Anesthesia Assessment:                        - Prior to the procedure, a History and Physical was                         performed, and patient medications and allergies were                         reviewed. The patient is competent. The risks and                         benefits of the procedure and the sedation options and                         risks were discussed with the patient. All questions                         were answered and informed consent was obtained.                         Patient identification and proposed procedure were                         verified by the physician, the nurse, the anesthetist                         and the technician in the endoscopy suite. Mental                         Status Examination: alert and oriented. Airway                         Examination: normal oropharyngeal airway and neck                         mobility. Respiratory Examination: clear to                         auscultation. CV Examination: RRR, no murmurs, no S3  or S4. Prophylactic Antibiotics: The patient does not                         require prophylactic antibiotics. Prior                         Anticoagulants: The patient has taken no anticoagulant                          or antiplatelet agents. ASA Grade Assessment: III - A                         patient with severe systemic disease. After reviewing                         the risks and benefits, the patient was deemed in                         satisfactory condition to undergo the procedure. The                         anesthesia plan was to use monitored anesthesia care                         (MAC). Immediately prior to administration of                         medications, the patient was re-assessed for adequacy                         to receive sedatives. The heart rate, respiratory                         rate, oxygen saturations, blood pressure, adequacy of                         pulmonary ventilation, and response to care were                         monitored throughout the procedure. The physical                         status of the patient was re-assessed after the                         procedure.                        After obtaining informed consent, the endoscope was                         passed under direct vision. Throughout the procedure,                         the patient's blood pressure, pulse, and oxygen                         saturations were monitored continuously. The Endoscope  was introduced through the mouth, and advanced to the                         second part of duodenum. The upper GI endoscopy was                         accomplished without difficulty. The patient tolerated                         the procedure well. Findings:      The duodenal bulb, first portion of the duodenum and second portion of       the duodenum were normal. Estimated blood loss: none.      Localized mild inflammation characterized by erosions and erythema was       found in the gastric antrum. Biopsies were taken with a cold forceps for       Helicobacter pylori testing. Estimated blood loss was minimal.      A medium-sized hiatal hernia was  present. Estimated blood loss: none.      The exam of the stomach was otherwise normal.      LA Grade A (one or more mucosal breaks less than 5 mm, not extending       between tops of 2 mucosal folds) esophagitis with no bleeding was found.       Estimated blood loss: none.      The Z-line was regular. Estimated blood loss: none.      Esophagogastric landmarks were identified: the gastroesophageal junction       was found at 40 cm from the incisors.      The exam of the esophagus was otherwise normal. Impression:            - Normal duodenal bulb, first portion of the duodenum                         and second portion of the duodenum.                        - Gastritis. Biopsied.                        - Medium-sized hiatal hernia.                        - LA Grade A reflux esophagitis with no bleeding.                        - Z-line regular.                        - Esophagogastric landmarks identified. Recommendation:        - Patient has a contact number available for                         emergencies. The signs and symptoms of potential                         delayed complications were discussed with the patient.                         Return to normal  activities tomorrow. Written                         discharge instructions were provided to the patient.                        - Discharge patient to home.                        - Resume previous diet.                        - Continue present medications.                        - Await pathology results.                        - Return to GI clinic as previously scheduled.                        - The findings and recommendations were discussed with                         the patient. Procedure Code(s):     --- Professional ---                        [phone], Esophagogastroduodenoscopy, flexible,                         transoral; with biopsy, single or multiple Diagnosis Code(s):     --- Professional ---                         K29.70, Gastritis, unspecified, without bleeding                        K44.9, Diaphragmatic hernia without obstruction or                         gangrene                        K21.00, Gastro-esophageal reflux disease with                         esophagitis, without bleeding CPT copyright 2022 [employer]. All rights reserved. The codes documented in this report are preliminary and upon coder review may  be revised to meet current compliance requirements. Attending Participation:      I personally performed the entire procedure. Volney American, DO Annamaria Helling DO, DO 04/25/2022 7:51:22 AM This report has been signed electronically. Number of Addenda: 0 Note Initiated On: 04/25/2022 7:28 AM Estimated Blood Loss:  Estimated blood loss was minimal.      [facility]

## 2022-04-25 NOTE — Op Note (Signed)
[facility] Gastroenterology Patient Name: Marie Morales Procedure Date: 04/25/2022 7:24 AM MRN: [id number] Account #: [id number] Date of Birth: 12-25-1975 Admit Type: Outpatient Age: 47 Room: [facility] ENDO ROOM 2 Gender: Female Note Status: Finalized Instrument Name: Colonscope [id number] Procedure:             Colonoscopy Indications:           Screening for colorectal malignant neoplasm Providers:             Annamaria Helling DO, DO Referring MD:          Renee Rival (Referring MD) Medicines:             Monitored Anesthesia Care Complications:         No immediate complications. Estimated blood loss: None. Procedure:             Pre-Anesthesia Assessment:                        - Prior to the procedure, a History and Physical was                         performed, and patient medications and allergies were                         reviewed. The patient is competent. The risks and                         benefits of the procedure and the sedation options and                         risks were discussed with the patient. All questions                         were answered and informed consent was obtained.                         Patient identification and proposed procedure were                         verified by the physician, the nurse, the anesthetist                         and the technician in the endoscopy suite. Mental                         Status Examination: alert and oriented. Airway                         Examination: normal oropharyngeal airway and neck                         mobility. Respiratory Examination: clear to                         auscultation. CV Examination: RRR, no murmurs, no S3                         or S4. Prophylactic Antibiotics: The patient does not  require prophylactic antibiotics. Prior                         Anticoagulants: The patient has taken no anticoagulant                         or  antiplatelet agents. ASA Grade Assessment: III - A                         patient with severe systemic disease. After reviewing                         the risks and benefits, the patient was deemed in                         satisfactory condition to undergo the procedure. The                         anesthesia plan was to use monitored anesthesia care                         (MAC). Immediately prior to administration of                         medications, the patient was re-assessed for adequacy                         to receive sedatives. The heart rate, respiratory                         rate, oxygen saturations, blood pressure, adequacy of                         pulmonary ventilation, and response to care were                         monitored throughout the procedure. The physical                         status of the patient was re-assessed after the                         procedure.                        After obtaining informed consent, the colonoscope was                         passed under direct vision. Throughout the procedure,                         the patient's blood pressure, pulse, and oxygen                         saturations were monitored continuously. The                         Colonoscope was introduced through the anus and  advanced to the the terminal ileum, with                         identification of the appendiceal orifice and IC                         valve. The colonoscopy was performed without                         difficulty. The patient tolerated the procedure well.                         The quality of the bowel preparation was evaluated                         using the BBPS [facility] Bowel Preparation Scale) with                         scores of: Right Colon = 2 (minor amount of residual                         staining, small fragments of stool and/or opaque                         liquid, but mucosa seen well),  Transverse Colon = 2                         (minor amount of residual staining, small fragments of                         stool and/or opaque liquid, but mucosa seen well) and                         Left Colon = 3 (entire mucosa seen well with no                         residual staining, small fragments of stool or opaque                         liquid). The total BBPS score equals 7. The quality of                         the bowel preparation was good. The terminal ileum,                         ileocecal valve, appendiceal orifice, and rectum were                         photographed. Findings:      The perianal and digital rectal examinations were normal. Pertinent       negatives include normal sphincter tone.      The terminal ileum appeared normal. Estimated blood loss: none.      Retroflexion in the right colon was performed.      Non-bleeding internal hemorrhoids were found during retroflexion. The       hemorrhoids were Grade I (internal hemorrhoids that do not prolapse).  Estimated blood loss: none.      The exam was otherwise without abnormality on direct and retroflexion       views. Impression:            - The examined portion of the ileum was normal.                        - Non-bleeding internal hemorrhoids.                        - The examination was otherwise normal on direct and                         retroflexion views.                        - No specimens collected. Recommendation:        - Patient has a contact number available for                         emergencies. The signs and symptoms of potential                         delayed complications were discussed with the patient.                         Return to normal activities tomorrow. Written                         discharge instructions were provided to the patient.                        - Discharge patient to home.                        - Resume previous diet.                        -  Continue present medications.                        - Repeat colonoscopy in 10 years for screening                         purposes.                        - Return to GI office as previously scheduled.                        - The findings and recommendations were discussed with                         the patient. Procedure Code(s):     --- Professional ---                        [id number], Colonoscopy, flexible; diagnostic, including                         collection of specimen(s) by brushing or washing, when  performed (separate procedure) Diagnosis Code(s):     --- Professional ---                        Z12.11, Encounter for screening for malignant neoplasm                         of colon                        K64.0, First degree hemorrhoids CPT copyright 2022 [employer]. All rights reserved. The codes documented in this report are preliminary and upon coder review may  be revised to meet current compliance requirements. Attending Participation:      I personally performed the entire procedure. Volney American, DO Annamaria Helling DO, DO 04/25/2022 8:15:44 AM This report has been signed electronically. Number of Addenda: 0 Note Initiated On: 04/25/2022 7:24 AM Scope Withdrawal Time: 0 hours 13 minutes 42 seconds  Total Procedure Duration: 0 hours 16 minutes 51 seconds  Estimated Blood Loss:  Estimated blood loss: none.      [facility]

## 2022-04-26 ENCOUNTER — Encounter: Payer: Self-pay | Admitting: Gastroenterology

## 2022-04-26 LAB — SURGICAL PATHOLOGY

## 2022-05-16 ENCOUNTER — Other Ambulatory Visit: Payer: Self-pay | Admitting: Orthopedic Surgery

## 2022-05-16 DIAGNOSIS — M2392 Unspecified internal derangement of left knee: Secondary | ICD-10-CM

## 2022-05-16 DIAGNOSIS — M2352 Chronic instability of knee, left knee: Secondary | ICD-10-CM

## 2022-05-17 ENCOUNTER — Ambulatory Visit
Admission: RE | Admit: 2022-05-17 | Discharge: 2022-05-17 | Disposition: A | Discharge status: Home / Self Care | Payer: BC Managed Care – PPO | Source: Ambulatory Visit | Attending: Orthopedic Surgery | Admitting: Orthopedic Surgery

## 2022-05-17 DIAGNOSIS — M2392 Unspecified internal derangement of left knee: Secondary | ICD-10-CM | POA: Diagnosis present

## 2022-05-17 DIAGNOSIS — M2352 Chronic instability of knee, left knee: Secondary | ICD-10-CM | POA: Diagnosis present

## 2022-05-26 ENCOUNTER — Other Ambulatory Visit: Payer: Self-pay | Admitting: Surgery

## 2022-05-26 DIAGNOSIS — M1712 Unilateral primary osteoarthritis, left knee: Secondary | ICD-10-CM

## 2022-05-31 ENCOUNTER — Ambulatory Visit
Admission: RE | Admit: 2022-05-31 | Discharge: 2022-05-31 | Disposition: A | Discharge status: Home / Self Care | Payer: BC Managed Care – PPO | Source: Ambulatory Visit | Attending: Surgery | Admitting: Surgery

## 2022-05-31 DIAGNOSIS — M1712 Unilateral primary osteoarthritis, left knee: Secondary | ICD-10-CM | POA: Diagnosis present

## 2023-05-30 IMAGING — US US EXTREM LOW VENOUS*R*
1 series · 14 of 24 positions shown · non-contrast
Comparison: 04/10/2019

CLINICAL DATA: Right lower extremity swelling for 2 weeks. History
of DVT.

EXAM:
Right LOWER EXTREMITY VENOUS DOPPLER ULTRASOUND
TECHNIQUE: Gray-scale sonography with compression, as well as color and duplex
ultrasound, were performed to evaluate the deep venous system(s)
from the level of the common femoral vein through the popliteal and
proximal calf veins.

[Series 1: us venous img lower uni right (dvt) · portal-venous · 14 of 44 slices shown]
[im 1/44]
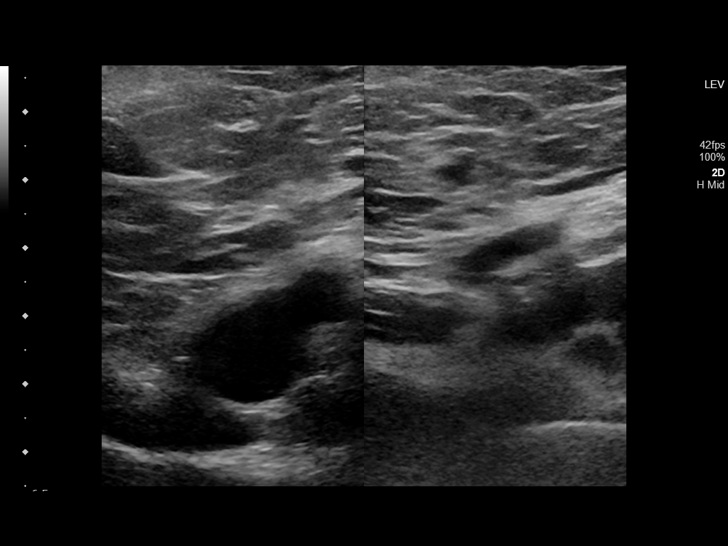
[im 4/44]
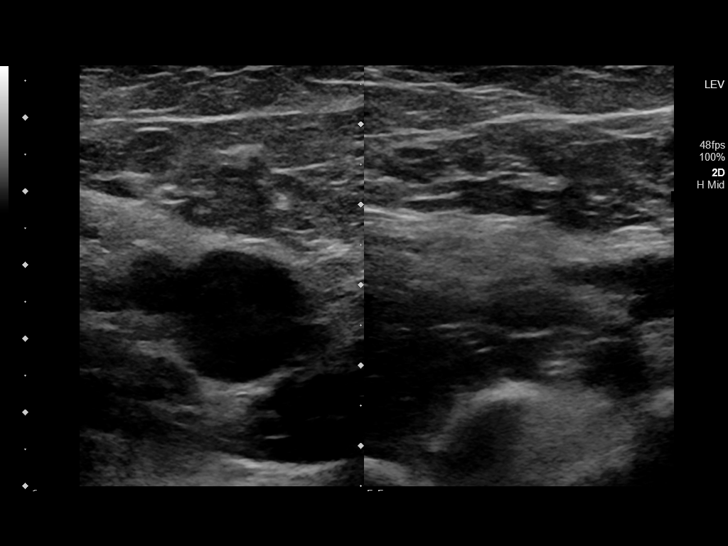
[im 8/44]
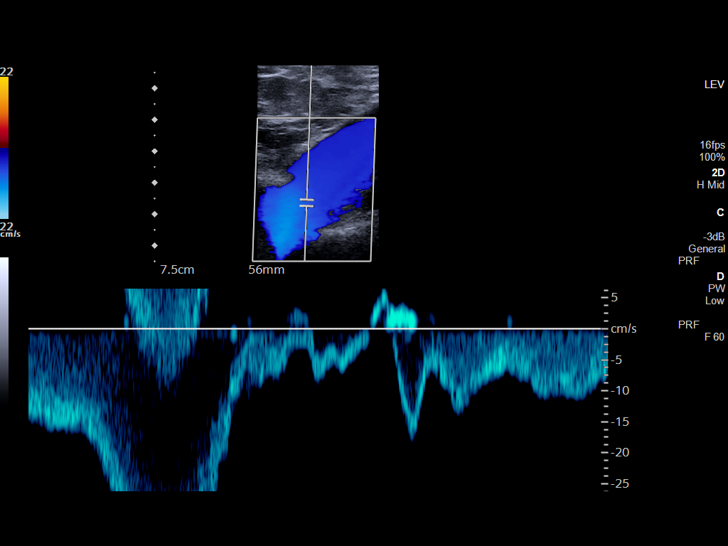
[im 12/44]
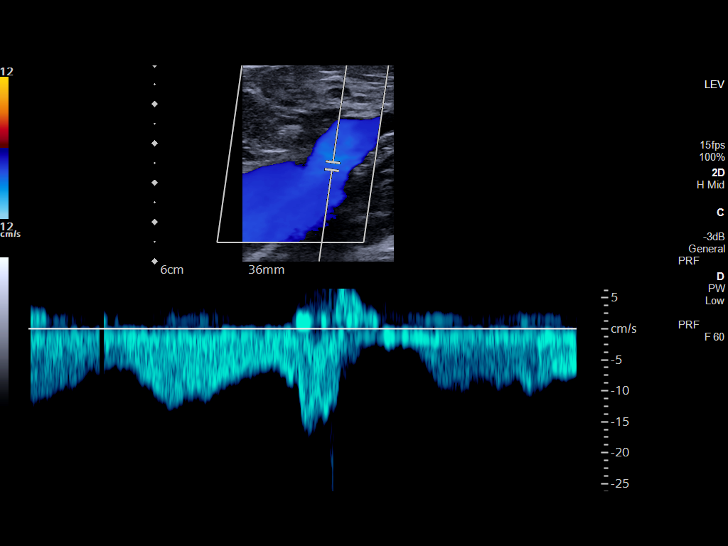
[im 14/44]
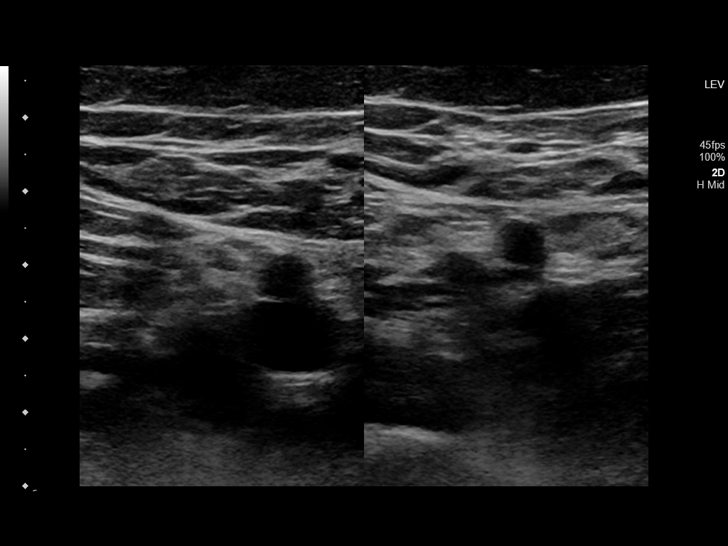
[im 17/44]
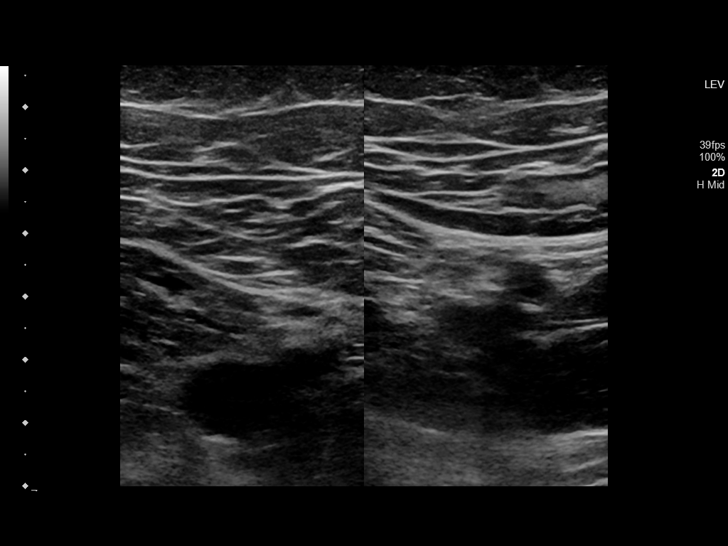
[im 21/44]
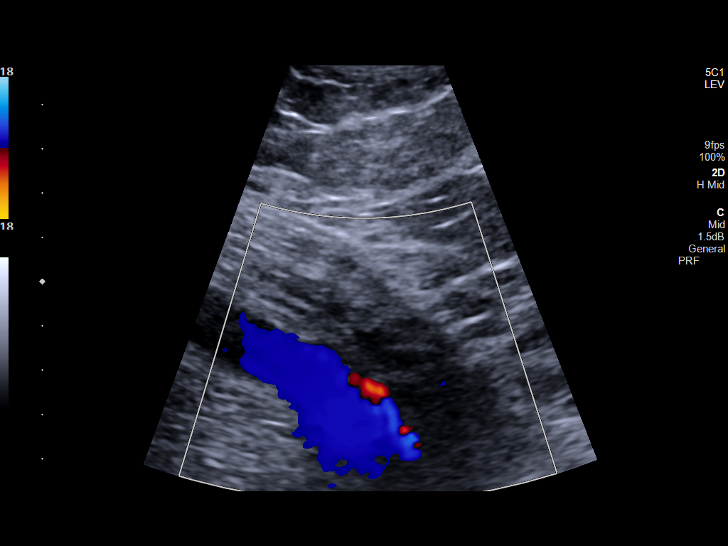
[im 23/44]
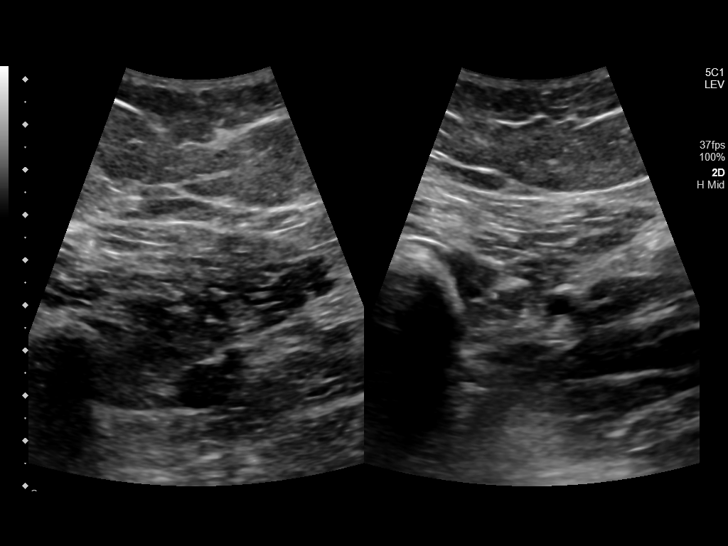
[im 27/44]
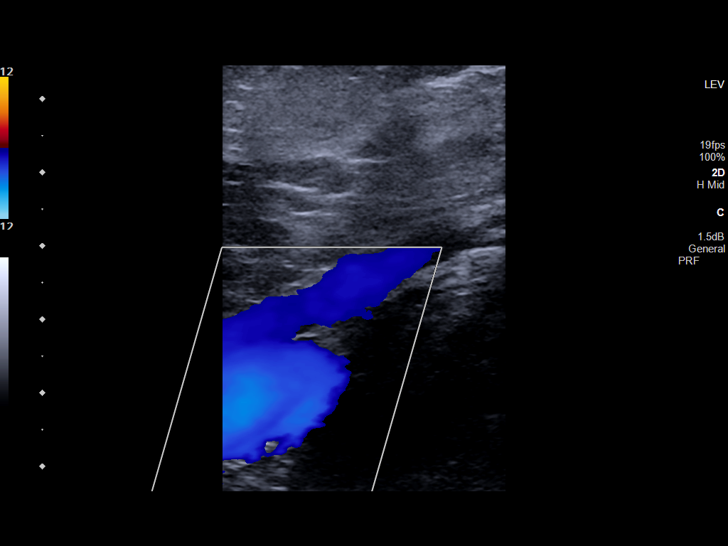
[im 30/44]
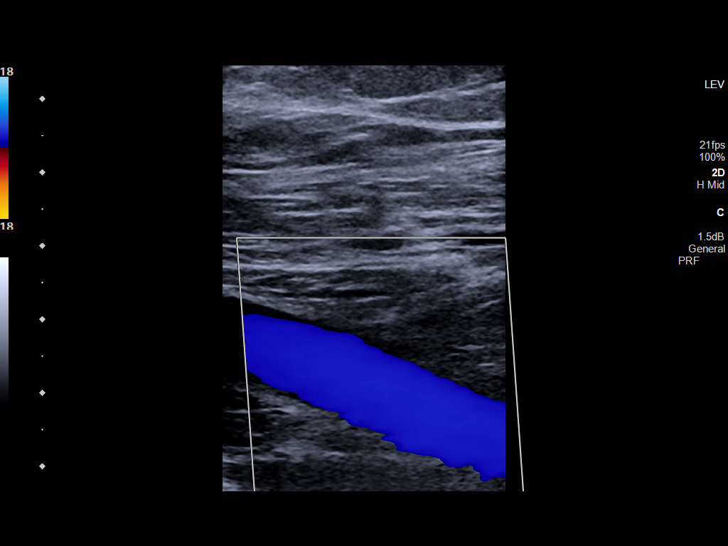
[im 34/44]
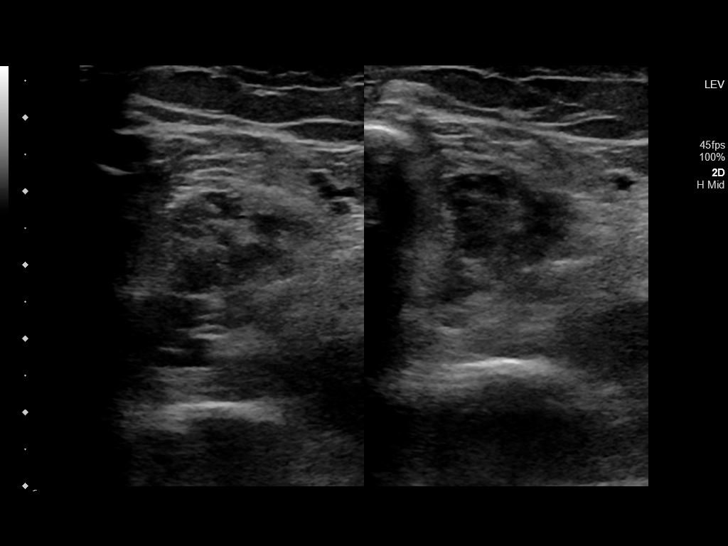
[im 36/44]
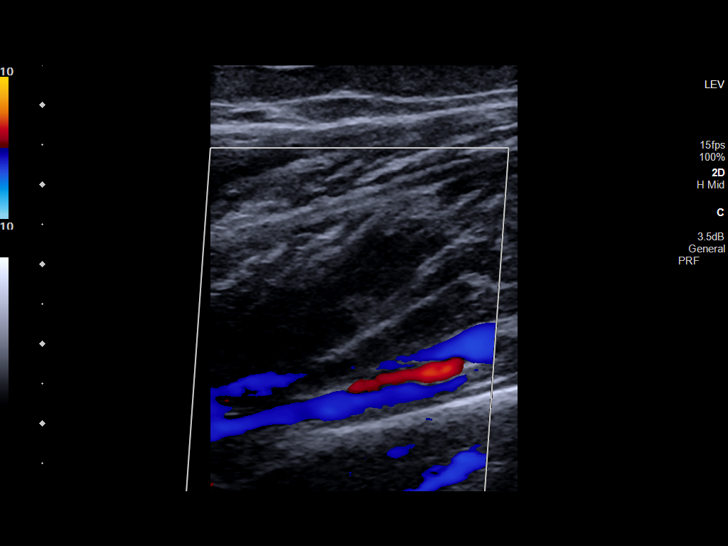
[im 40/44]
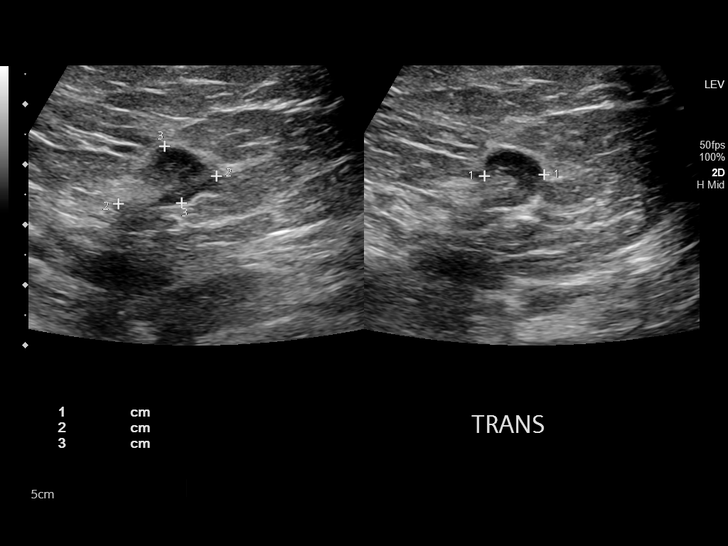
[im 44/44]
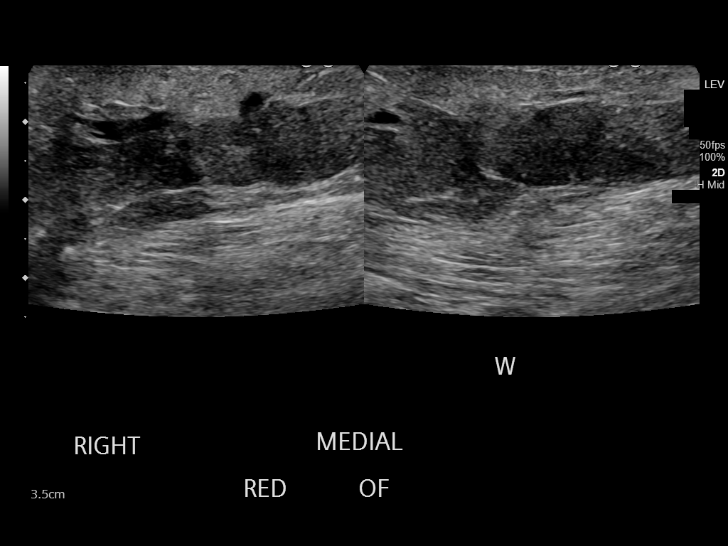

[14 of 24 positions shown; findings below may reference images not displayed]

FINDINGS: VENOUS

Normal compressibility of the common femoral, superficial femoral,
and popliteal veins, as well as the visualized calf veins.
Visualized portions of profunda femoral vein and great saphenous
vein unremarkable. No filling defects to suggest DVT on grayscale or
color Doppler imaging. Doppler waveforms show normal direction of
venous flow, normal respiratory plasticity and response to
augmentation.

Limited views of the contralateral common femoral vein are
unremarkable.

OTHER

None.

Limitations: none
IMPRESSION: Negative.
